# Patient Record
Sex: Male | Born: 1955 | Race: Black or African American | Hispanic: No | Marital: Single | Smoking: Former smoker
Health system: Southern US, Community
[De-identification: ages and names within clinical notes are randomized; demographics above are authoritative.]

## PROBLEM LIST (undated history)

## (undated) DIAGNOSIS — I429 Cardiomyopathy, unspecified: Secondary | ICD-10-CM

## (undated) DIAGNOSIS — K760 Fatty (change of) liver, not elsewhere classified: Secondary | ICD-10-CM

## (undated) DIAGNOSIS — M62838 Other muscle spasm: Secondary | ICD-10-CM

## (undated) DIAGNOSIS — M1990 Unspecified osteoarthritis, unspecified site: Secondary | ICD-10-CM

## (undated) DIAGNOSIS — M79674 Pain in right toe(s): Secondary | ICD-10-CM

## (undated) DIAGNOSIS — F17200 Nicotine dependence, unspecified, uncomplicated: Secondary | ICD-10-CM

## (undated) DIAGNOSIS — R0602 Shortness of breath: Secondary | ICD-10-CM

## (undated) DIAGNOSIS — I10 Essential (primary) hypertension: Secondary | ICD-10-CM

## (undated) DIAGNOSIS — K625 Hemorrhage of anus and rectum: Secondary | ICD-10-CM

## (undated) DIAGNOSIS — I499 Cardiac arrhythmia, unspecified: Secondary | ICD-10-CM

## (undated) DIAGNOSIS — E663 Overweight: Secondary | ICD-10-CM

## (undated) DIAGNOSIS — R001 Bradycardia, unspecified: Secondary | ICD-10-CM

## (undated) DIAGNOSIS — Z8709 Personal history of other diseases of the respiratory system: Secondary | ICD-10-CM

## (undated) DIAGNOSIS — Z5900 Homelessness unspecified: Secondary | ICD-10-CM

## (undated) DIAGNOSIS — E7800 Pure hypercholesterolemia, unspecified: Secondary | ICD-10-CM

## (undated) DIAGNOSIS — F1020 Alcohol dependence, uncomplicated: Secondary | ICD-10-CM

## (undated) DIAGNOSIS — H269 Unspecified cataract: Secondary | ICD-10-CM

## (undated) DIAGNOSIS — F1210 Cannabis abuse, uncomplicated: Secondary | ICD-10-CM

## (undated) DIAGNOSIS — Z8719 Personal history of other diseases of the digestive system: Secondary | ICD-10-CM

## (undated) DIAGNOSIS — I509 Heart failure, unspecified: Secondary | ICD-10-CM

## (undated) HISTORY — DX: Alcohol dependence, uncomplicated: F10.20

## (undated) HISTORY — DX: Pure hypercholesterolemia, unspecified: E78.00

## (undated) HISTORY — DX: Fatty (change of) liver, not elsewhere classified: K76.0

## (undated) HISTORY — DX: Unspecified osteoarthritis, unspecified site: M19.90

## (undated) HISTORY — PX: SKIN SURGERY: SHX2413

## (undated) HISTORY — DX: Other muscle spasm: M62.838

## (undated) HISTORY — DX: Homelessness unspecified: Z59.00

## (undated) HISTORY — DX: Cannabis abuse, uncomplicated: F12.10

## (undated) HISTORY — DX: Unspecified cataract: H26.9

## (undated) HISTORY — DX: Cardiomyopathy, unspecified: I42.9

## (undated) HISTORY — DX: Bradycardia, unspecified: R00.1

## (undated) HISTORY — DX: Personal history of other diseases of the respiratory system: Z87.09

## (undated) HISTORY — DX: Pain in right toe(s): M79.674

## (undated) HISTORY — DX: Nicotine dependence, unspecified, uncomplicated: F17.200

## (undated) HISTORY — DX: Personal history of other diseases of the digestive system: Z87.19

## (undated) HISTORY — DX: Overweight: E66.3

## (undated) HISTORY — PX: LUNG SURGERY: SHX703

## (undated) HISTORY — DX: Shortness of breath: R06.02

## (undated) HISTORY — DX: Hemorrhage of anus and rectum: K62.5

## (undated) HISTORY — PX: SHOULDER SURGERY: SHX246

---

## 1998-04-09 ENCOUNTER — Emergency Department (HOSPITAL_COMMUNITY): Admission: EM | Admit: 1998-04-09 | Discharge: 1998-04-09 | Payer: Self-pay | Admitting: Emergency Medicine

## 1998-05-01 ENCOUNTER — Emergency Department (HOSPITAL_COMMUNITY): Admission: EM | Admit: 1998-05-01 | Discharge: 1998-05-01 | Payer: Self-pay | Admitting: Emergency Medicine

## 1998-07-19 ENCOUNTER — Emergency Department (HOSPITAL_COMMUNITY): Admission: EM | Admit: 1998-07-19 | Discharge: 1998-07-19 | Payer: Self-pay | Admitting: Emergency Medicine

## 2000-12-15 ENCOUNTER — Emergency Department (HOSPITAL_COMMUNITY): Admission: EM | Admit: 2000-12-15 | Discharge: 2000-12-15 | Payer: Self-pay

## 2002-05-03 ENCOUNTER — Encounter: Payer: Self-pay | Admitting: Emergency Medicine

## 2002-05-03 ENCOUNTER — Emergency Department (HOSPITAL_COMMUNITY): Admission: EM | Admit: 2002-05-03 | Discharge: 2002-05-03 | Payer: Self-pay | Admitting: Emergency Medicine

## 2007-01-14 ENCOUNTER — Emergency Department (HOSPITAL_COMMUNITY): Admission: EM | Admit: 2007-01-14 | Discharge: 2007-01-14 | Payer: Self-pay | Admitting: Emergency Medicine

## 2007-08-07 ENCOUNTER — Ambulatory Visit: Payer: Self-pay | Admitting: Family Medicine

## 2007-08-07 LAB — CONVERTED CEMR LAB
Albumin: 4.3 g/dL (ref 3.5–5.2)
Alkaline Phosphatase: 52 units/L (ref 39–117)
CO2: 21 meq/L (ref 19–32)
Chloride: 106 meq/L (ref 96–112)
Glucose, Bld: 88 mg/dL (ref 70–99)
Lymphocytes Relative: 44 % (ref 12–46)
Lymphs Abs: 1.7 10*3/uL (ref 0.7–3.3)
Neutro Abs: 1.8 10*3/uL (ref 1.7–7.7)
Neutrophils Relative %: 45 % (ref 43–77)
Platelets: 277 10*3/uL (ref 150–400)
Potassium: 4.4 meq/L (ref 3.5–5.3)
Sodium: 142 meq/L (ref 135–145)
T3 Uptake Ratio: 28.7 % (ref 22.5–37.0)
T4, Total: 6.4 ug/dL (ref 5.0–12.5)
TSH: 0.706 microintl units/mL (ref 0.350–5.50)
Total Protein: 7.7 g/dL (ref 6.0–8.3)
WBC: 3.9 10*3/uL — ABNORMAL LOW (ref 4.0–10.5)

## 2007-08-21 ENCOUNTER — Ambulatory Visit: Payer: Self-pay | Admitting: Family Medicine

## 2008-04-11 ENCOUNTER — Emergency Department (HOSPITAL_COMMUNITY): Admission: EM | Admit: 2008-04-11 | Discharge: 2008-04-11 | Payer: Self-pay | Admitting: Emergency Medicine

## 2008-04-13 ENCOUNTER — Ambulatory Visit: Payer: Self-pay | Admitting: *Deleted

## 2008-05-09 ENCOUNTER — Ambulatory Visit: Payer: Self-pay | Admitting: Family Medicine

## 2008-05-09 LAB — CONVERTED CEMR LAB
Basophils Relative: 1 % (ref 0–1)
Cholesterol: 225 mg/dL — ABNORMAL HIGH (ref 0–200)
Eosinophils Absolute: 0.1 10*3/uL (ref 0.0–0.7)
Glucose, Bld: 104 mg/dL — ABNORMAL HIGH (ref 70–99)
HDL: 85 mg/dL (ref >39)
Lymphs Abs: 1.5 10*3/uL (ref 0.7–4.0)
MCV: 93.1 fL (ref 78.0–100.0)
Neutrophils Relative %: 50 % (ref 43–77)
Platelets: 306 10*3/uL (ref 150–400)
Potassium: 4.6 meq/L (ref 3.5–5.3)
Sodium: 142 meq/L (ref 135–145)
Total CHOL/HDL Ratio: 2.6
Triglycerides: 89 mg/dL (ref <150)
VLDL: 18 mg/dL (ref 0–40)
WBC: 4.2 10*3/uL (ref 4.0–10.5)

## 2008-05-11 ENCOUNTER — Ambulatory Visit (HOSPITAL_COMMUNITY): Admission: RE | Admit: 2008-05-11 | Discharge: 2008-05-11 | Payer: Self-pay | Admitting: Family Medicine

## 2008-06-03 ENCOUNTER — Ambulatory Visit: Payer: Self-pay | Admitting: Internal Medicine

## 2008-07-12 ENCOUNTER — Ambulatory Visit (HOSPITAL_COMMUNITY): Admission: RE | Admit: 2008-07-12 | Discharge: 2008-07-12 | Payer: Self-pay | Admitting: Family Medicine

## 2008-08-15 ENCOUNTER — Ambulatory Visit: Payer: Self-pay | Admitting: Family Medicine

## 2009-02-13 ENCOUNTER — Encounter (INDEPENDENT_AMBULATORY_CARE_PROVIDER_SITE_OTHER): Payer: Self-pay | Admitting: Family Medicine

## 2009-02-13 ENCOUNTER — Ambulatory Visit: Payer: Self-pay | Admitting: Family Medicine

## 2009-02-13 LAB — CONVERTED CEMR LAB
Basophils Absolute: 0 10*3/uL (ref 0.0–0.1)
Eosinophils Absolute: 0.1 10*3/uL (ref 0.0–0.7)
Eosinophils Relative: 3 % (ref 0–5)
HCT: 44.8 % (ref 39.0–52.0)
MCHC: 32.8 g/dL (ref 30.0–36.0)
MCV: 91.6 fL (ref 78.0–100.0)
Platelets: 281 10*3/uL (ref 150–400)
RDW: 13.4 % (ref 11.5–15.5)

## 2009-02-27 ENCOUNTER — Ambulatory Visit (HOSPITAL_COMMUNITY): Admission: RE | Admit: 2009-02-27 | Discharge: 2009-02-27 | Payer: Self-pay | Admitting: Internal Medicine

## 2009-02-28 ENCOUNTER — Ambulatory Visit: Payer: Self-pay | Admitting: Family Medicine

## 2009-03-28 ENCOUNTER — Emergency Department (HOSPITAL_COMMUNITY): Admission: EM | Admit: 2009-03-28 | Discharge: 2009-03-28 | Payer: Self-pay | Admitting: Emergency Medicine

## 2009-03-29 ENCOUNTER — Ambulatory Visit: Payer: Self-pay | Admitting: Internal Medicine

## 2009-04-17 ENCOUNTER — Encounter (INDEPENDENT_AMBULATORY_CARE_PROVIDER_SITE_OTHER): Payer: Self-pay | Admitting: Family Medicine

## 2009-04-17 ENCOUNTER — Ambulatory Visit: Payer: Self-pay | Admitting: Internal Medicine

## 2009-04-17 LAB — CONVERTED CEMR LAB
ALT: 17 units/L (ref 0–53)
AST: 18 units/L (ref 0–37)
Alkaline Phosphatase: 37 units/L — ABNORMAL LOW (ref 39–117)
Basophils Absolute: 0 10*3/uL (ref 0.0–0.1)
Calcium: 9.4 mg/dL (ref 8.4–10.5)
Chloride: 107 meq/L (ref 96–112)
Creatinine, Ser: 1.06 mg/dL (ref 0.40–1.50)
Eosinophils Absolute: 0.1 10*3/uL (ref 0.0–0.7)
Lymphocytes Relative: 33 % (ref 12–46)
Lymphs Abs: 1.2 10*3/uL (ref 0.7–4.0)
Neutrophils Relative %: 51 % (ref 43–77)
Platelets: 270 10*3/uL (ref 150–400)
Potassium: 4 meq/L (ref 3.5–5.3)
RDW: 12.8 % (ref 11.5–15.5)
WBC: 3.6 10*3/uL — ABNORMAL LOW (ref 4.0–10.5)

## 2009-06-09 ENCOUNTER — Ambulatory Visit: Payer: Self-pay | Admitting: Family Medicine

## 2009-06-17 ENCOUNTER — Emergency Department (HOSPITAL_COMMUNITY): Admission: EM | Admit: 2009-06-17 | Discharge: 2009-06-17 | Payer: Self-pay | Admitting: Emergency Medicine

## 2009-06-20 ENCOUNTER — Encounter: Payer: Self-pay | Admitting: Internal Medicine

## 2009-06-20 ENCOUNTER — Ambulatory Visit (HOSPITAL_COMMUNITY): Admission: RE | Admit: 2009-06-20 | Discharge: 2009-06-20 | Payer: Self-pay | Admitting: Gastroenterology

## 2009-07-06 ENCOUNTER — Ambulatory Visit: Payer: Self-pay | Admitting: Internal Medicine

## 2009-07-06 DIAGNOSIS — R0602 Shortness of breath: Secondary | ICD-10-CM | POA: Insufficient documentation

## 2009-07-06 DIAGNOSIS — M129 Arthropathy, unspecified: Secondary | ICD-10-CM | POA: Insufficient documentation

## 2009-07-06 DIAGNOSIS — Z8719 Personal history of other diseases of the digestive system: Secondary | ICD-10-CM

## 2009-07-06 DIAGNOSIS — I498 Other specified cardiac arrhythmias: Secondary | ICD-10-CM

## 2009-07-06 LAB — CONVERTED CEMR LAB
BUN: 10 mg/dL (ref 6–23)
Basophils Absolute: 0 10*3/uL (ref 0.0–0.1)
CO2: 26 meq/L (ref 19–32)
Chloride: 106 meq/L (ref 96–112)
Creatinine, Ser: 1 mg/dL (ref 0.4–1.5)
Eosinophils Absolute: 0.1 10*3/uL (ref 0.0–0.7)
Free T4: 0.9 ng/dL (ref 0.6–1.6)
Glucose, Bld: 107 mg/dL — ABNORMAL HIGH (ref 70–99)
HCT: 43.1 % (ref 39.0–52.0)
Lymphs Abs: 1.5 10*3/uL (ref 0.7–4.0)
MCHC: 34.5 g/dL (ref 30.0–36.0)
MCV: 93.1 fL (ref 78.0–100.0)
Monocytes Absolute: 0.6 10*3/uL (ref 0.1–1.0)
Neutrophils Relative %: 60.1 % (ref 43.0–77.0)
Platelets: 257 10*3/uL (ref 150.0–400.0)
Potassium: 3.7 meq/L (ref 3.5–5.1)
RDW: 12 % (ref 11.5–14.6)
TSH: 1.53 microintl units/mL (ref 0.35–5.50)
WBC: 5.4 10*3/uL (ref 4.5–10.5)

## 2009-07-17 ENCOUNTER — Telehealth (INDEPENDENT_AMBULATORY_CARE_PROVIDER_SITE_OTHER): Payer: Self-pay | Admitting: Radiology

## 2009-07-18 ENCOUNTER — Ambulatory Visit: Payer: Self-pay

## 2009-07-18 ENCOUNTER — Encounter: Payer: Self-pay | Admitting: Internal Medicine

## 2009-07-18 ENCOUNTER — Encounter: Payer: Self-pay | Admitting: Cardiology

## 2009-08-22 ENCOUNTER — Ambulatory Visit: Payer: Self-pay | Admitting: Family Medicine

## 2009-08-23 ENCOUNTER — Ambulatory Visit: Payer: Self-pay | Admitting: Internal Medicine

## 2009-08-23 DIAGNOSIS — E7800 Pure hypercholesterolemia, unspecified: Secondary | ICD-10-CM

## 2009-08-25 LAB — CONVERTED CEMR LAB
ALT: 22 units/L (ref 0–53)
Alkaline Phosphatase: 47 units/L (ref 39–117)
Bilirubin, Direct: 0.1 mg/dL (ref 0.0–0.3)
HDL: 66 mg/dL (ref >39.00)
Total Bilirubin: 1.8 mg/dL — ABNORMAL HIGH (ref 0.3–1.2)
Total Protein: 8.8 g/dL — ABNORMAL HIGH (ref 6.0–8.3)

## 2009-11-09 ENCOUNTER — Ambulatory Visit: Payer: Self-pay | Admitting: Family Medicine

## 2009-11-09 LAB — CONVERTED CEMR LAB
ALT: 17 units/L (ref 0–53)
AST: 26 units/L (ref 0–37)
Basophils Absolute: 0 10*3/uL (ref 0.0–0.1)
Bilirubin, Direct: 0.1 mg/dL (ref 0.0–0.3)
Eosinophils Absolute: 0.1 10*3/uL (ref 0.0–0.7)
Eosinophils Relative: 2 % (ref 0–5)
HCT: 40.8 % (ref 39.0–52.0)
Hemoglobin: 14 g/dL (ref 13.0–17.0)
Indirect Bilirubin: 0.6 mg/dL (ref 0.0–0.9)
Lymphocytes Relative: 30 % (ref 12–46)
Lymphs Abs: 1.4 10*3/uL (ref 0.7–4.0)
MCV: 91.5 fL (ref 78.0–100.0)
Monocytes Absolute: 0.5 10*3/uL (ref 0.1–1.0)
Platelets: 282 10*3/uL (ref 150–400)
RDW: 12.2 % (ref 11.5–15.5)
Total Protein: 7.4 g/dL (ref 6.0–8.3)
Vit D, 25-Hydroxy: 12 ng/mL — ABNORMAL LOW (ref 30–89)

## 2009-12-19 ENCOUNTER — Ambulatory Visit: Payer: Self-pay | Admitting: Family Medicine

## 2010-05-17 ENCOUNTER — Ambulatory Visit: Payer: Self-pay | Admitting: Family Medicine

## 2010-08-30 ENCOUNTER — Ambulatory Visit: Payer: Self-pay | Admitting: Family Medicine

## 2011-04-08 LAB — CLOTEST (H. PYLORI), BIOPSY: Helicobacter screen: NEGATIVE

## 2011-04-11 LAB — APTT: aPTT: 24 seconds (ref 24–37)

## 2011-04-11 LAB — DIFFERENTIAL
Basophils Relative: 1 % (ref 0–1)
Lymphocytes Relative: 38 % (ref 12–46)
Lymphs Abs: 1.4 10*3/uL (ref 0.7–4.0)
Monocytes Absolute: 0.5 10*3/uL (ref 0.1–1.0)
Monocytes Relative: 14 % — ABNORMAL HIGH (ref 3–12)
Neutro Abs: 1.7 10*3/uL (ref 1.7–7.7)
Neutrophils Relative %: 45 % (ref 43–77)

## 2011-04-11 LAB — URINALYSIS, ROUTINE W REFLEX MICROSCOPIC
Ketones, ur: 15 mg/dL — AB
Nitrite: NEGATIVE
pH: 6 (ref 5.0–8.0)

## 2011-04-11 LAB — POCT CARDIAC MARKERS: CKMB, poc: 1.7 ng/mL (ref 1.0–8.0)

## 2011-04-11 LAB — POCT I-STAT, CHEM 8
Calcium, Ion: 1.1 mmol/L — ABNORMAL LOW (ref 1.12–1.32)
Chloride: 106 mEq/L (ref 96–112)
HCT: 43 % (ref 39.0–52.0)
Potassium: 4 mEq/L (ref 3.5–5.1)
Sodium: 139 mEq/L (ref 135–145)

## 2011-04-11 LAB — SAMPLE TO BLOOD BANK

## 2011-04-11 LAB — CBC
Hemoglobin: 15.1 g/dL (ref 13.0–17.0)
RBC: 4.63 MIL/uL (ref 4.22–5.81)
WBC: 3.8 10*3/uL — ABNORMAL LOW (ref 4.0–10.5)

## 2011-04-11 LAB — PROTIME-INR: INR: 1 (ref 0.00–1.49)

## 2011-04-12 ENCOUNTER — Ambulatory Visit (HOSPITAL_COMMUNITY)
Admission: RE | Admit: 2011-04-12 | Discharge: 2011-04-12 | Disposition: A | Discharge status: Home / Self Care | Payer: Self-pay | Source: Ambulatory Visit | Attending: Family Medicine | Admitting: Family Medicine

## 2011-04-12 ENCOUNTER — Other Ambulatory Visit (HOSPITAL_COMMUNITY): Payer: Self-pay | Admitting: Family Medicine

## 2011-04-12 DIAGNOSIS — M546 Pain in thoracic spine: Secondary | ICD-10-CM | POA: Insufficient documentation

## 2011-04-12 DIAGNOSIS — M545 Low back pain, unspecified: Secondary | ICD-10-CM | POA: Insufficient documentation

## 2011-04-12 DIAGNOSIS — R52 Pain, unspecified: Secondary | ICD-10-CM

## 2011-05-14 NOTE — Op Note (Signed)
NAME:  ECTOR, LAUREL NO.:  [id number]   MEDICAL RECORD NO.:  [id number]          PATIENT TYPE:  AMB   LOCATION:  ENDO                         FACILITY:  [facility]   PHYSICIAN:  Ulyess Mort, MD  DATE OF BIRTH:  June 11, 1956   DATE OF PROCEDURE:  06/20/2009  DATE OF DISCHARGE:  06/20/2009                               OPERATIVE REPORT   PROCEDURE:  Colonoscopy and upper endoscopy.   INDICATIONS FOR PROCEDURE:  The patient is a 55 year old African  American who presents with a several-month history of bright red blood  per rectum.  Has not seen any red blood in the stool.  He sees it with  wiping primarily.  It is intermittent.  He says his rectum is  burning._there has been no history in the past of hemorrhoids.  There  are no GI symptoms of any sort.  No change in bowel habits.  He drinks  beer daily and takes some sinus medicine, otherwise he is not taking any  other medicine.  He had heme positive stools when he was examined by Dr.  Audria Nine.  He is on Tramadol 50 every 8 hours and some Protonix 40 mg  daily.  He has no family history of any malignancies that he is aware  of.  He has basically been in good health in the past.  He is  unemployed, but works as a Gaffer intermittently.  The patient has had  a lung resection in the past and also has a history of arthritis.  The  lung resection was related to spontaneous pneumothorax.   PHYSICAL EXAMINATION:  VITAL SIGNS:  Pulse 44 with a sinus bradycardia  and short PR interval.  This was noted on a 12-lead EKG.  No evidence of  any block.  He denies any syncopal episodes in the past or any shortness  of breath.  His blood pressure was 140/83, respirations 18.  HEENT:  His eyes were somewhat blood-shot.  His oropharynx was negative.  NECK:  Negative.  CHEST:  Clear.  HEART:  Bradycardia with no murmurs of significance.  ABDOMEN:  Soft.  No masses or organomegaly.  Nontender.  EXTREMITIES:  Unremarkable.   IMPRESSION:  1. Rectal bleeding as described.  2. Sinus bradycardia.  3. History of lung resection secondary to pneumothorax.  4. Arthritis.   RECOMMENDATIONS:  The patient will have a colonoscopic examination and  if negative we will proceed with an upper endoscopy.   ANESTHESIA:  The patient is premedicated with 100 mg of Fentanyl, 10 mg  of Versed and 12.5 mg of Phenergan IV.  Oropharynx was anesthetized with  Cetacaine spray.   DESCRIPTION OF PROCEDURE:  The Pentax colonoscope was advanced  throughout the entire colon without difficulty.  On retraction a careful  inspection of the colon revealed a well-prepped colon for the most part.  There were a few small diverticula in the right and left colon, but  nothing of great significance.  The endoscope was brought back into the  rectum and turned into the J position and no significant abnormalities  were noted.  The  endoscope was then returned back to normal position and  retracted completely and 1+ hemorrhoids was noted, but otherwise it was  negative.  Rectal examination revealed no abnormalities.   IMPRESSION:  Colon from rectum to sigmoid revealed several tiny  diverticula with 1+ hemorrhoids, otherwise entirely normal colon  examination.   PROCEDURE:  Upper endoscopy with same history.   ANESTHESIA:  Same.   DESCRIPTION OF PROCEDURE:  The Pentax endoscope was passed into the  proximal esophagus without difficulty.  The proximal esophagus was  normal.  Distal esophagus revealed a 1-2 cm sliding hiatal hernia.  The  stomach was well-visualized in its entirety and revealed a mild diffuse  gastritis and some antral streaking consistent with mild to moderate  antritis.  __________ was normal.  The duodenal bulb revealed minimal  changes of duodenitis.  The C-loop was normal.  The endoscope was  brought back into the stomach and the stomach was well-visualized and no  abnormalities were seen.  The endoscope was retracted  completely after  CLOtest was obtained and the patient tolerated the procedure nicely.   IMPRESSION:  1. Esophagus with small hiatal hernia as described.  2. Mild to moderate gastritis with CLOtest obtained and some moderate      antritis.  3. Pyloric channel with slight inflammation.  4. Duodenal bulb with mild duodenitis.  5. The procedure was normal.  There were some inflammatory changes      here and I would suspect this is on the basis of medication and/or      increased alcohol intake as a culprit here.  I do not think this is      the etiology for his positive stools or bleeding, however, and I      would treat him continuously with proton pump inhibitor for the      next 6-10 weeks and then wean him off carefully and hopefully we      can decrease and wean him off from his alcohol intake.  I think      that he should have a follow-up colonoscopic examination in 5 years      unless there is further evidence of bleeding or change in bowel      habits.      Ulyess Mort, MD  Electronically Signed     SML/MEDQ  D:  06/20/2009  T:  06/21/2009  Job:  [id number]   cc:   Dala Dock   Dr. Dory Horn

## 2011-05-21 IMAGING — CR DG THORACIC SPINE 2V
3 series · 3 of 3 positions shown · non-contrast
Comparison: Chest x-ray 07/06/2009

CLINICAL DATA: Chronic pain.  No injury.

THORACIC SPINE - 2 VIEW

[t t-spine a.p.]
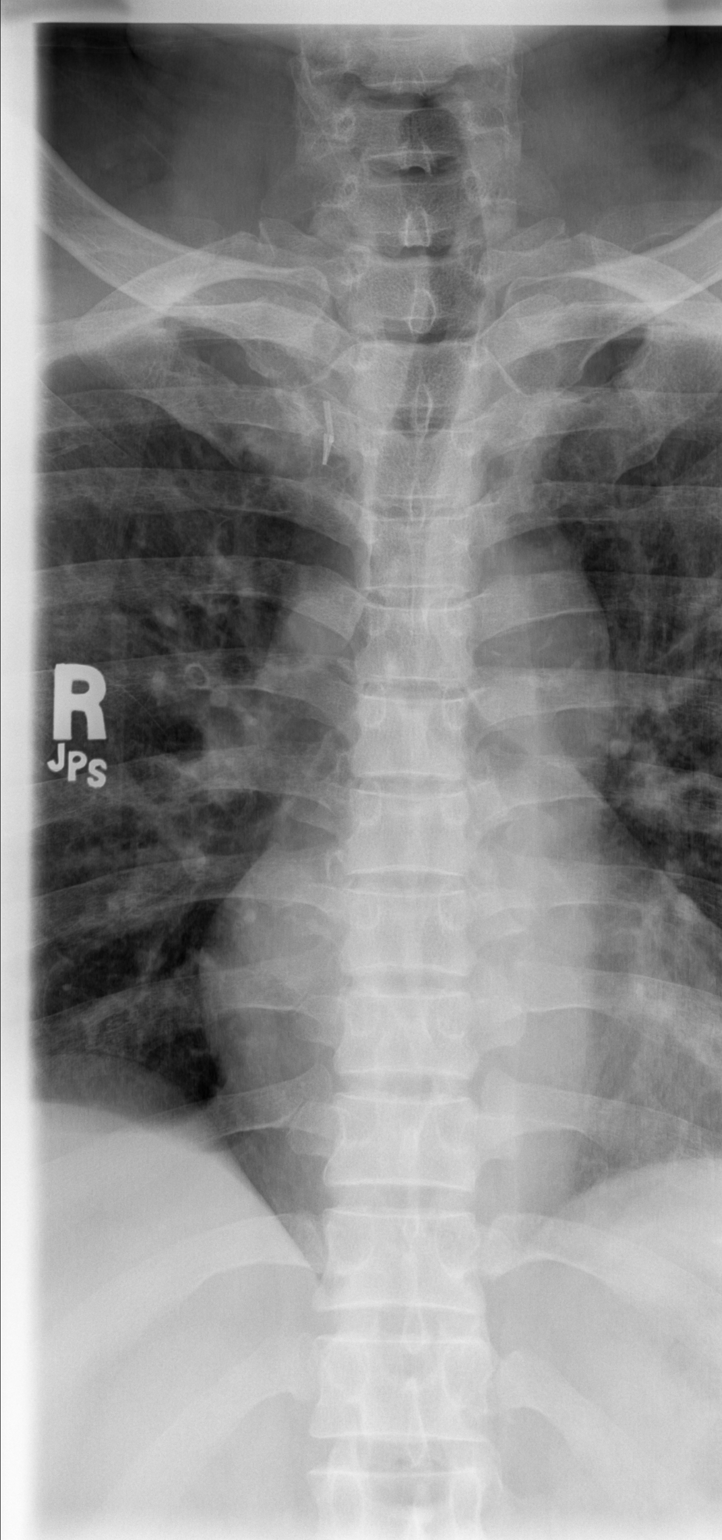

[t t-spine lat *]
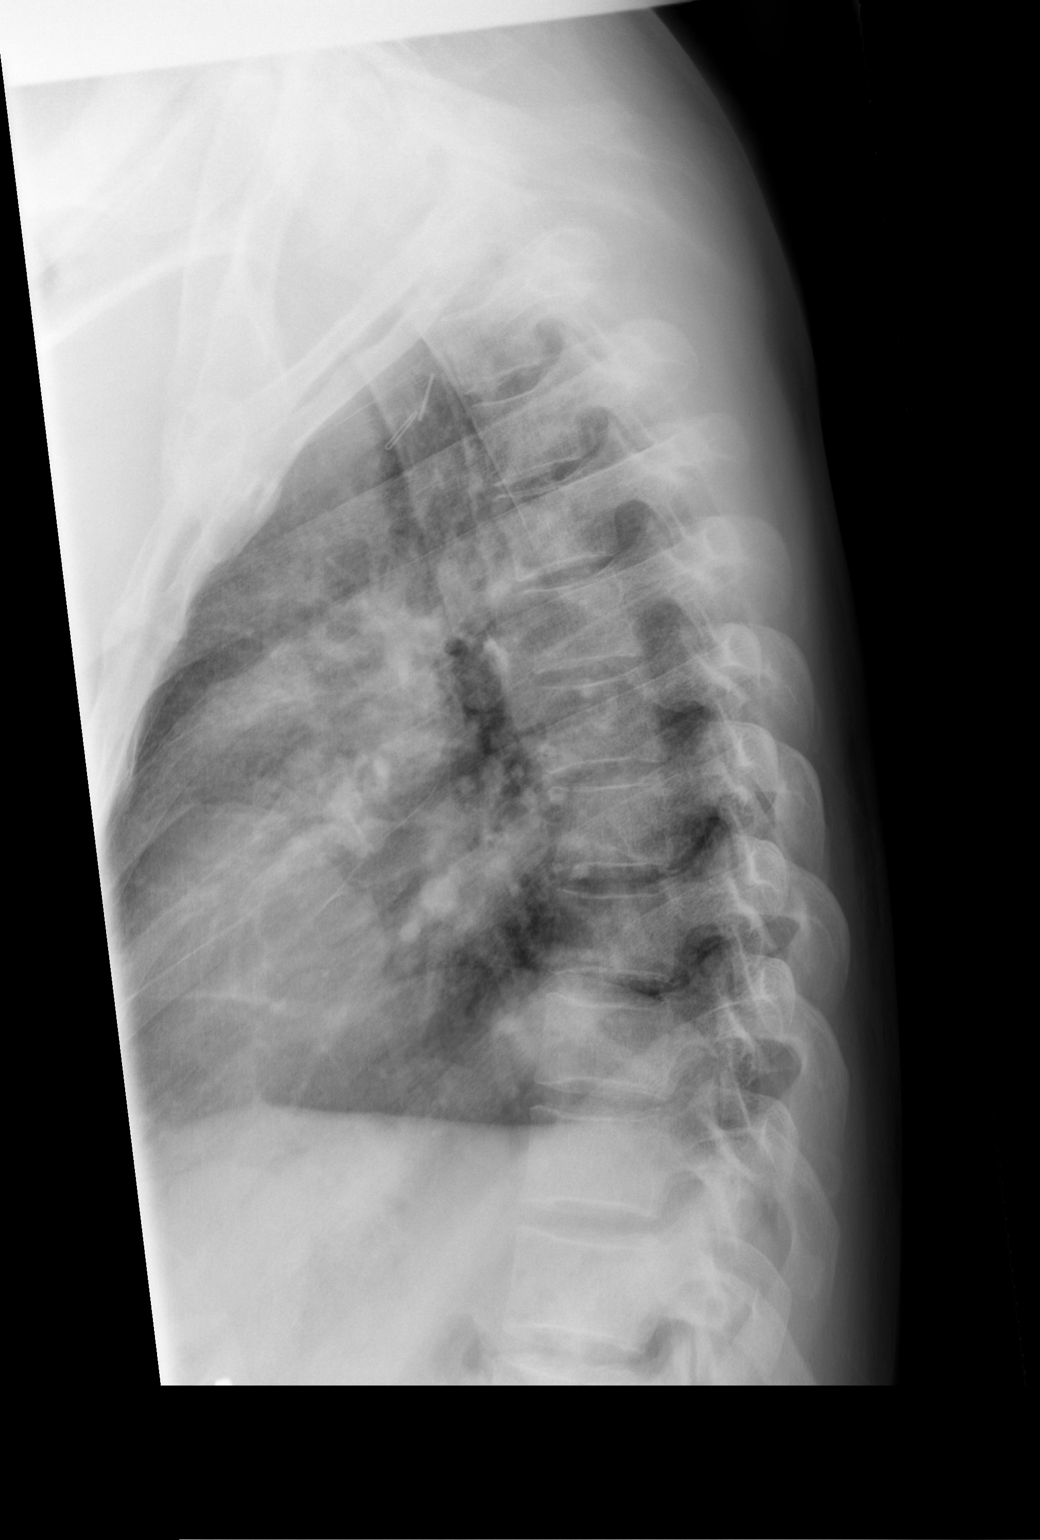

[t swimmers]
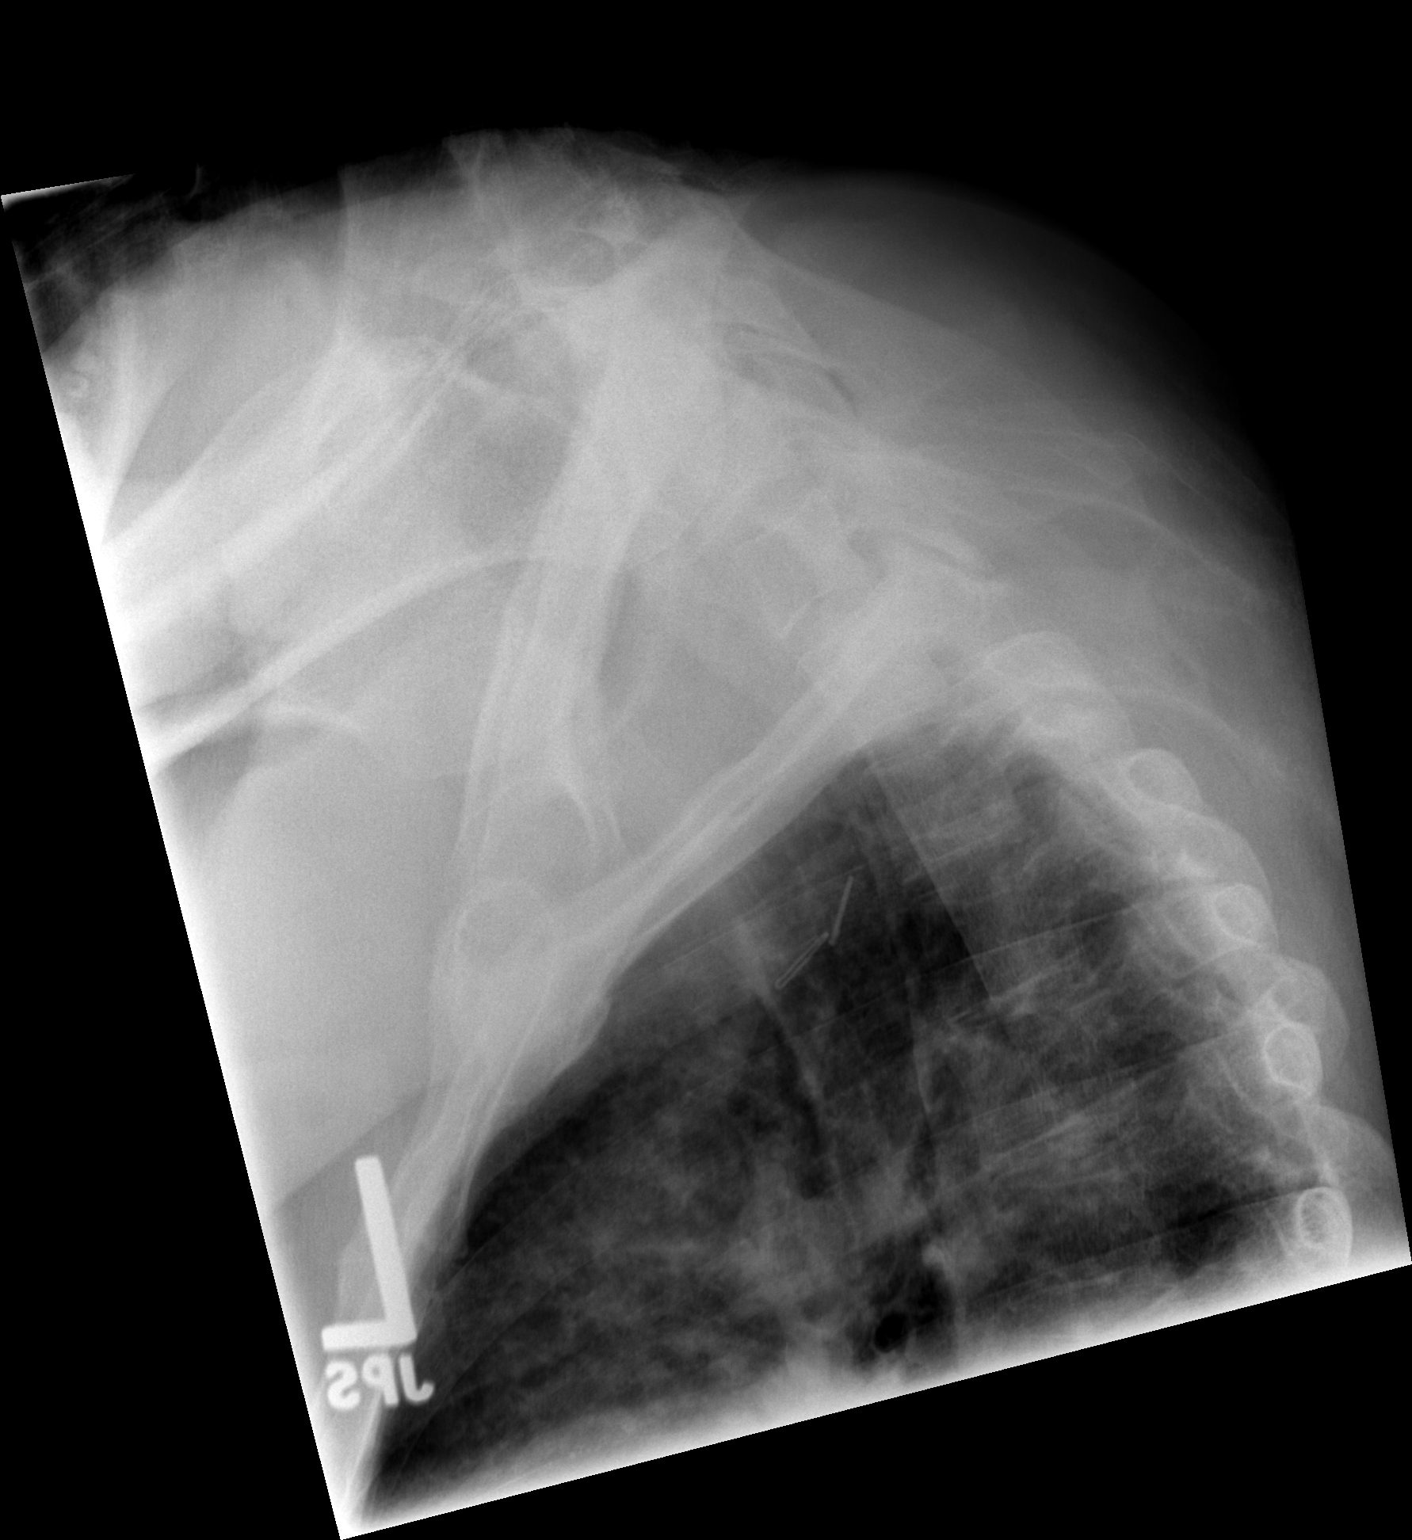

[3 of 3 positions shown; findings below may reference images not displayed]

FINDINGS: No acute bony abnormality.  Specifically, no fracture or
malalignment.  No significant degenerative disease.
IMPRESSION: No acute findings or significant abnormality.

## 2011-05-21 IMAGING — CR DG LUMBAR SPINE COMPLETE 4+V
5 series · 5 of 5 positions shown · non-contrast
Comparison: CT 03/28/2009

CLINICAL DATA: Low back pain, chronic.  No injury.

LUMBAR SPINE - COMPLETE 4+ VIEW

[t l-spine a.p.]
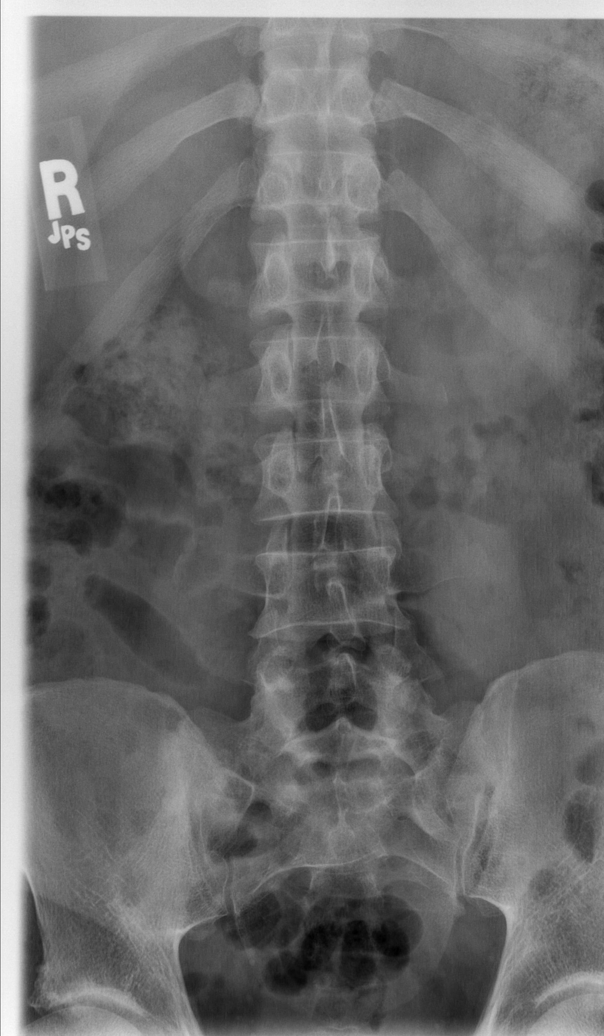

[t l-spine oblique exposure (1 of 2)]
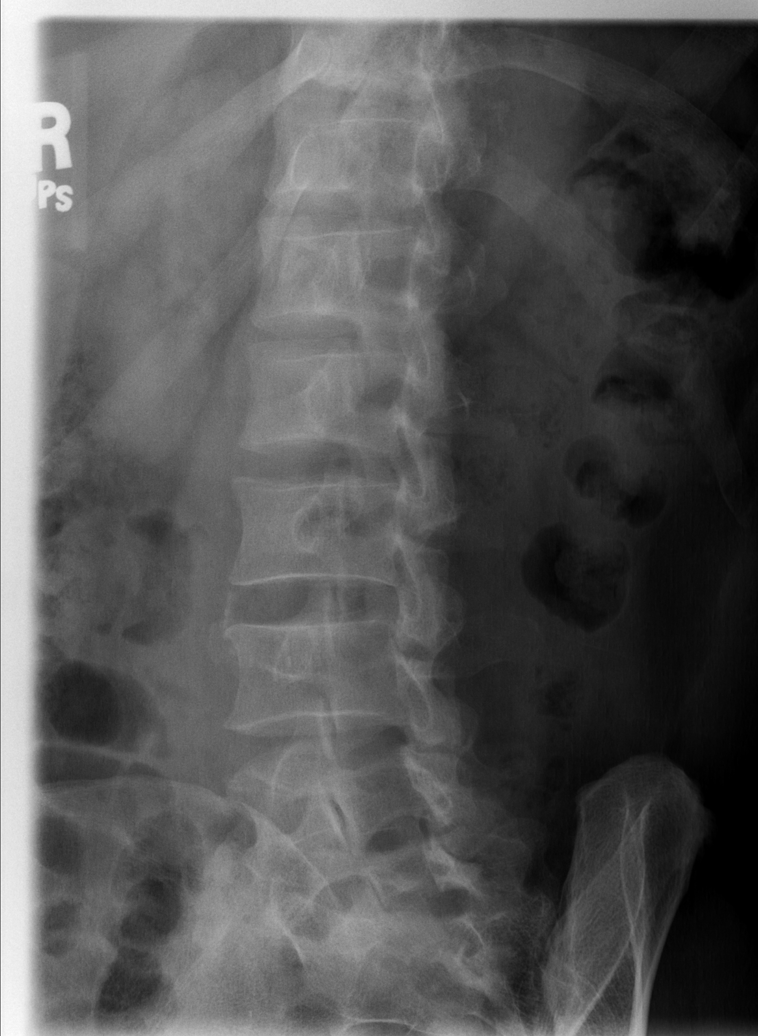

[t l-spine oblique exposure (2 of 2)]
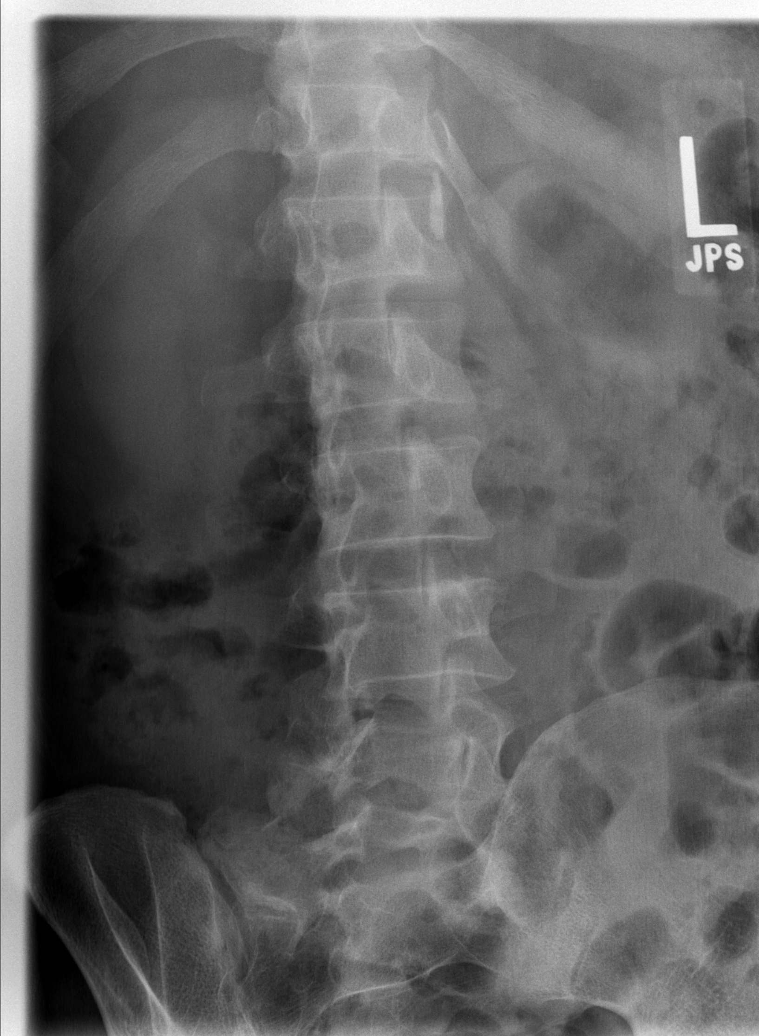

[t l-spine lat]
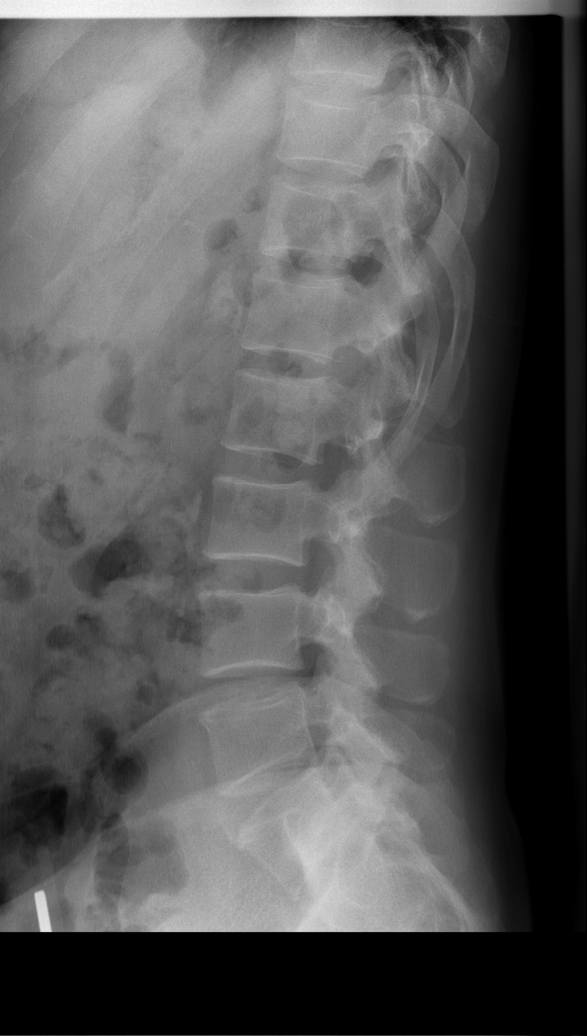

[t l-spine l5-s1 spot]
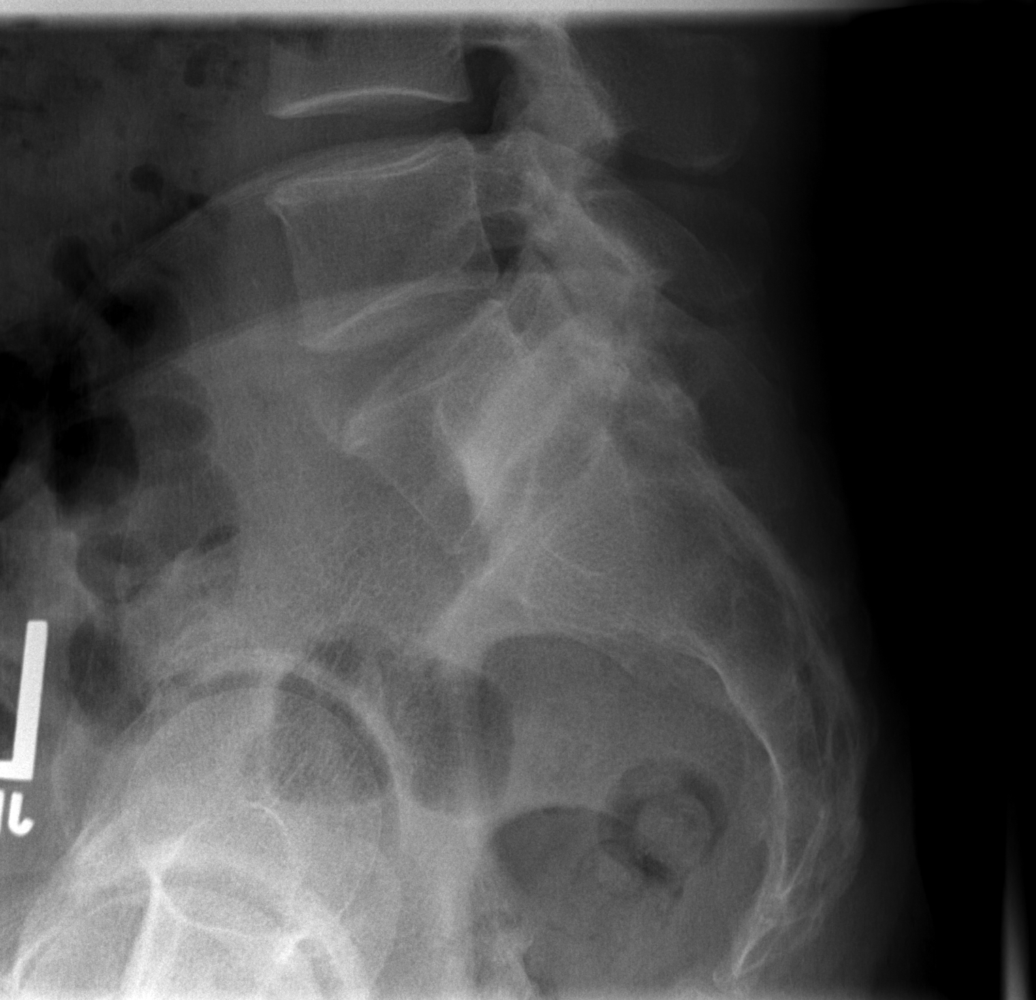

[5 of 5 positions shown; findings below may reference images not displayed]

FINDINGS: There are five lumbar-type vertebral bodies.  No fracture
or malalignment.  Disc spaces well maintained.  SI joints are
symmetric. Mild facet arthropathy noted in the lower lumbar spine
at L3-4 through L5-S1.
IMPRESSION: Lower lumbar facet arthropathy.  No acute findings.

## 2011-07-01 ENCOUNTER — Ambulatory Visit: Payer: Self-pay | Admitting: Physical Therapy

## 2011-07-17 ENCOUNTER — Ambulatory Visit: Payer: Self-pay | Attending: Family Medicine | Admitting: Physical Therapy

## 2011-07-17 DIAGNOSIS — IMO0001 Reserved for inherently not codable concepts without codable children: Secondary | ICD-10-CM | POA: Insufficient documentation

## 2011-07-17 DIAGNOSIS — M545 Low back pain, unspecified: Secondary | ICD-10-CM | POA: Insufficient documentation

## 2011-07-17 DIAGNOSIS — R293 Abnormal posture: Secondary | ICD-10-CM | POA: Insufficient documentation

## 2011-07-17 DIAGNOSIS — M25659 Stiffness of unspecified hip, not elsewhere classified: Secondary | ICD-10-CM | POA: Insufficient documentation

## 2011-07-17 DIAGNOSIS — M6281 Muscle weakness (generalized): Secondary | ICD-10-CM | POA: Insufficient documentation

## 2011-07-23 ENCOUNTER — Ambulatory Visit: Payer: Self-pay | Admitting: Rehabilitative and Restorative Service Providers"

## 2011-07-30 ENCOUNTER — Ambulatory Visit: Payer: Self-pay | Admitting: Physical Therapy

## 2011-08-01 ENCOUNTER — Ambulatory Visit: Payer: Self-pay | Admitting: Physical Therapy

## 2011-08-06 ENCOUNTER — Ambulatory Visit: Payer: Self-pay | Attending: Family Medicine | Admitting: Physical Therapy

## 2011-08-06 DIAGNOSIS — M25659 Stiffness of unspecified hip, not elsewhere classified: Secondary | ICD-10-CM | POA: Insufficient documentation

## 2011-08-06 DIAGNOSIS — M6281 Muscle weakness (generalized): Secondary | ICD-10-CM | POA: Insufficient documentation

## 2011-08-06 DIAGNOSIS — IMO0001 Reserved for inherently not codable concepts without codable children: Secondary | ICD-10-CM | POA: Insufficient documentation

## 2011-08-06 DIAGNOSIS — M545 Low back pain, unspecified: Secondary | ICD-10-CM | POA: Insufficient documentation

## 2011-08-06 DIAGNOSIS — R293 Abnormal posture: Secondary | ICD-10-CM | POA: Insufficient documentation

## 2011-08-08 ENCOUNTER — Ambulatory Visit: Payer: Self-pay | Admitting: Physical Therapy

## 2011-08-21 ENCOUNTER — Encounter: Payer: Self-pay | Admitting: Physical Therapy

## 2011-08-28 ENCOUNTER — Encounter: Payer: Self-pay | Admitting: Physical Therapy

## 2011-10-14 ENCOUNTER — Emergency Department (HOSPITAL_COMMUNITY)
Admission: EM | Admit: 2011-10-14 | Discharge: 2011-10-14 | Disposition: A | Discharge status: Home / Self Care | Payer: Self-pay | Attending: Emergency Medicine | Admitting: Emergency Medicine

## 2011-10-14 DIAGNOSIS — L299 Pruritus, unspecified: Secondary | ICD-10-CM | POA: Insufficient documentation

## 2011-10-14 DIAGNOSIS — Z79899 Other long term (current) drug therapy: Secondary | ICD-10-CM | POA: Insufficient documentation

## 2011-10-14 LAB — POCT I-STAT, CHEM 8
BUN: 13 mg/dL (ref 6–23)
Calcium, Ion: 1.23 mmol/L (ref 1.12–1.32)
Chloride: 105 mEq/L (ref 96–112)
Glucose, Bld: 103 mg/dL — ABNORMAL HIGH (ref 70–99)
HCT: 44 % (ref 39.0–52.0)

## 2012-01-27 ENCOUNTER — Ambulatory Visit (HOSPITAL_COMMUNITY)
Admission: RE | Admit: 2012-01-27 | Discharge: 2012-01-27 | Disposition: A | Discharge status: Home / Self Care | Payer: Self-pay | Source: Ambulatory Visit | Attending: Family Medicine | Admitting: Family Medicine

## 2012-01-27 ENCOUNTER — Other Ambulatory Visit (HOSPITAL_COMMUNITY): Payer: Self-pay | Admitting: Family Medicine

## 2012-01-27 DIAGNOSIS — R0602 Shortness of breath: Secondary | ICD-10-CM | POA: Insufficient documentation

## 2012-01-27 DIAGNOSIS — R059 Cough, unspecified: Secondary | ICD-10-CM | POA: Insufficient documentation

## 2012-01-27 DIAGNOSIS — R0689 Other abnormalities of breathing: Secondary | ICD-10-CM

## 2012-01-27 DIAGNOSIS — R05 Cough: Secondary | ICD-10-CM | POA: Insufficient documentation

## 2012-02-25 ENCOUNTER — Other Ambulatory Visit (HOSPITAL_COMMUNITY): Payer: Self-pay | Admitting: Family Medicine

## 2012-02-27 ENCOUNTER — Ambulatory Visit (HOSPITAL_COMMUNITY)
Admission: RE | Admit: 2012-02-27 | Discharge: 2012-02-27 | Disposition: A | Discharge status: Home / Self Care | Payer: Self-pay | Source: Ambulatory Visit | Attending: Family Medicine | Admitting: Family Medicine

## 2012-02-27 DIAGNOSIS — R109 Unspecified abdominal pain: Secondary | ICD-10-CM | POA: Insufficient documentation

## 2012-02-27 DIAGNOSIS — K7689 Other specified diseases of liver: Secondary | ICD-10-CM | POA: Insufficient documentation

## 2012-03-10 ENCOUNTER — Ambulatory Visit (HOSPITAL_COMMUNITY)
Admission: RE | Admit: 2012-03-10 | Discharge: 2012-03-10 | Disposition: A | Discharge status: Home / Self Care | Payer: Self-pay | Source: Ambulatory Visit | Attending: Family Medicine | Admitting: Family Medicine

## 2012-03-10 DIAGNOSIS — I059 Rheumatic mitral valve disease, unspecified: Secondary | ICD-10-CM | POA: Insufficient documentation

## 2012-03-10 DIAGNOSIS — R072 Precordial pain: Secondary | ICD-10-CM | POA: Insufficient documentation

## 2012-03-10 DIAGNOSIS — R0602 Shortness of breath: Secondary | ICD-10-CM

## 2012-03-10 NOTE — Progress Notes (Signed)
  Echocardiogram 2D Echocardiogram has been performed.  Gerald Bradley L 03/10/2012, 2:14 PM

## 2012-04-15 ENCOUNTER — Ambulatory Visit (INDEPENDENT_AMBULATORY_CARE_PROVIDER_SITE_OTHER): Payer: Self-pay | Admitting: Cardiovascular Disease

## 2012-04-15 ENCOUNTER — Encounter: Payer: Self-pay | Admitting: Cardiovascular Disease

## 2012-04-15 VITALS — BP 125/84 | HR 50 | Ht 71.0 in | Wt 161.0 lb

## 2012-04-15 DIAGNOSIS — R0602 Shortness of breath: Secondary | ICD-10-CM

## 2012-04-15 DIAGNOSIS — I428 Other cardiomyopathies: Secondary | ICD-10-CM

## 2012-04-15 NOTE — Patient Instructions (Signed)
.  Your physician recommends that you schedule a follow-up appointment in: 1 month  Your physician has requested that you have a stress echocardiogram. For further information please visit [email or web address]. Please follow instruction sheet as given.    

## 2012-04-19 ENCOUNTER — Encounter: Payer: Self-pay | Admitting: Cardiovascular Disease

## 2012-04-19 DIAGNOSIS — I429 Cardiomyopathy, unspecified: Secondary | ICD-10-CM | POA: Insufficient documentation

## 2012-04-19 NOTE — Assessment & Plan Note (Signed)
Suspect this is noncardiac considering his history of pneumothorax and tobacco use. However, he does have mild LV dysfunction. I have recommended a stress echo to evaluate for ischemia as a cause of exertional dyspnea. If this is negative, I don't think he requires further cardiac evaluation. He was counseled about the need to stop cocaine, Etoh, etc. He understands that polysubstance abuse leads to myocardial dysfunction and this will worsen with ongoing use. I advised him this is most important intervention he can make. If he stays clean, I would favor starting him on an ACE or ARB when he comes back for follow-up. I would avoid beta blockers in the setting of active cocaine use.

## 2012-04-19 NOTE — Assessment & Plan Note (Signed)
Uncertain etiology, but suspect alcohol is primary problem. Check stress echo. Start medical therapy if he stays off illicit drugs and returns for follow-up.

## 2012-04-19 NOTE — Progress Notes (Signed)
   HPI:  56 year-old gentleman referred for follow-up evaluation. He has a history of LV dysfunction with LVEF 45-50% by echo done last month (global hypokinesis). He has developed dyspnea with exertion. This has been progressive and now occurs with usual activity (Class 3). He denies orthopnea, PND, or edema. The patient has been seen by Dr Johney Frame in the past, but his last visit was in 2010 when he also complained of dyspnea. He had an echo showing normal LV function at that time. A Myoview showed question of inferior ischemia, but was low-risk overall.   The patient has not been on any regular cardiac meds. He takes a 'stomach medicine' but doesn't know the name. He has a history of spontaneous pneumothorax, tobacco abuse, alcohol abuse, and regular ongoing cocaine use. He has fleeting nonexeritonal chest pains. He denies palpitations, orthopnea, or PND.   No outpatient encounter prescriptions on file as of 04/15/2012.    Review of patient's allergies indicates no known allergies.  Past Medical History  Diagnosis Date  . Rectal bleeding     Hx of  . Arthritis   . Bradycardia     Past Surgical History  Procedure Date  . Lung surgery     resection for spontaneous pneumothorax skin graft    History   Social History  . Marital Status: Single    Spouse Name: N/A    Number of Children: N/A  . Years of Education: N/A   Occupational History  . Not on file.   Social History Main Topics  . Smoking status: Former Games developer  . Smokeless tobacco: Not on file  . Alcohol Use: Yes  . Drug Use:   . Sexually Active:    Other Topics Concern  . Not on file   Social History Narrative  . No narrative on file    History reviewed. No pertinent family history.  ROS: General: no fevers/chills/night sweats Eyes: no blurry vision, diplopia, or amaurosis ENT: no sore throat or hearing loss Resp: no cough, wheezing, or hemoptysis CV: no edema or palpitations GI: no abdominal pain, nausea,  vomiting, diarrhea, or constipation GU: no dysuria, frequency, or hematuria Skin: no rash Neuro: no headache, numbness, tingling, or weakness of extremities Musculoskeletal: no joint pain or swelling Heme: no bleeding, DVT, or easy bruising Endo: no polydipsia or polyuria  BP 125/84  Pulse 50  Ht 5\' 11"  (1.803 m)  Wt 73.029 kg (161 lb)  BMI 22.45 kg/m2  PHYSICAL EXAM: Pt is alert and oriented, WD, WN, in no distress. HEENT: normal Neck: JVP normal. Carotid upstrokes normal without bruits. No thyromegaly. Lungs: equal expansion, clear bilaterally CV: Apex is discrete and nondisplaced, RRR without murmur or gallop Abd: soft, NT, +BS, no bruit, no hepatosplenomegaly Back: no CVA tenderness Ext: no C/C/E        Femoral pulses 2+= without bruits        DP/PT pulses intact and = Skin: warm and dry without rash Neuro: CNII-XII intact             Strength intact = bilaterally  EKG:  Marked sinus bradycardia 49 bpm, voltage criteria for LVH without associated ST-T changes.  ASSESSMENT AND PLAN:

## 2012-04-27 ENCOUNTER — Ambulatory Visit (HOSPITAL_COMMUNITY): Payer: Self-pay

## 2012-04-27 ENCOUNTER — Encounter (HOSPITAL_COMMUNITY): Payer: Self-pay | Admitting: *Deleted

## 2012-04-27 ENCOUNTER — Emergency Department (HOSPITAL_COMMUNITY): Payer: Self-pay

## 2012-04-27 ENCOUNTER — Emergency Department (HOSPITAL_COMMUNITY)
Admission: EM | Admit: 2012-04-27 | Discharge: 2012-04-27 | Disposition: A | Discharge status: Other Acute Inpatient Hospital | Payer: Self-pay | Attending: Emergency Medicine | Admitting: Emergency Medicine

## 2012-04-27 DIAGNOSIS — S90111A Contusion of right great toe without damage to nail, initial encounter: Secondary | ICD-10-CM

## 2012-04-27 DIAGNOSIS — W2203XA Walked into furniture, initial encounter: Secondary | ICD-10-CM | POA: Insufficient documentation

## 2012-04-27 DIAGNOSIS — S90129A Contusion of unspecified lesser toe(s) without damage to nail, initial encounter: Secondary | ICD-10-CM | POA: Insufficient documentation

## 2012-04-27 DIAGNOSIS — Z79899 Other long term (current) drug therapy: Secondary | ICD-10-CM | POA: Insufficient documentation

## 2012-04-27 DIAGNOSIS — R609 Edema, unspecified: Secondary | ICD-10-CM | POA: Insufficient documentation

## 2012-04-27 DIAGNOSIS — Z8739 Personal history of other diseases of the musculoskeletal system and connective tissue: Secondary | ICD-10-CM | POA: Insufficient documentation

## 2012-04-27 MED ORDER — IBUPROFEN 800 MG PO TABS: 800.0000 mg | ORAL_TABLET | Freq: Three times a day (TID) | ORAL | Status: AC

## 2012-04-27 NOTE — ED Notes (Signed)
Pt able to use crutches without any difficulty

## 2012-04-27 NOTE — ED Notes (Signed)
Pt states he "bumped" his foot on a table leg and the pain has been increasing since questions.  R foot with edema, especially in digits.

## 2012-04-27 NOTE — ED Provider Notes (Signed)
Medical screening examination/treatment/procedure(s) were performed by non-physician practitioner and as supervising physician I was immediately available for consultation/collaboration.   Tremayne Sheldon, MD 04/27/12 2006 

## 2012-04-27 NOTE — ED Notes (Signed)
Pt states hurt rt foot by big toe on sat by accidentally kicking a table ,rt big toe throbbing ever since has good pulse to foot hurts to walk

## 2012-04-27 NOTE — Discharge Instructions (Signed)
Take ibuprofen, up to 800mg  three times a day, as needed for pain.  Ice 3 times a day for 15-20 minutes.  Elevate when possible.  Activity as tolerated.  Contact your cardiologist and let him/her know that you will not be able to walk on a treadmill today.  You may return to the ER if your pain worsens or you have any other concerns.

## 2012-04-27 NOTE — ED Provider Notes (Signed)
History     CSN: [id number]  Arrival date & time 04/27/12  0909   First MD Initiated Contact with Patient 04/27/12 1013      Chief Complaint  Patient presents with  . Foot Pain    R    (Consider location/radiation/quality/duration/timing/severity/associated sxs/prior treatment) HPI History provided by pt.   Pt stubbed his right foot on a table "or something" 2 days ago, then on a sofa that evening.  C/o severe, non-radiating pain at great toe w/ associated edema.  Unable to bear weight on forefoot.  Has not taken anything for symptoms.  No associated fever and no h/o gout.    Past Medical History  Diagnosis Date  . Rectal bleeding     Hx of  . Arthritis   . Bradycardia     Past Surgical History  Procedure Date  . Lung surgery     resection for spontaneous pneumothorax skin graft    No family history on file.  History  Substance Use Topics  . Smoking status: Former Smoker    Types: Cigarettes  . Smokeless tobacco: Not on file  . Alcohol Use: No     stop ped 1 month prior      Review of Systems  All other systems reviewed and are negative.    Allergies  Review of patient's allergies indicates no known allergies.  Home Medications   Current Outpatient Rx  Name Route Sig Dispense Refill  . BISMUTH SUBSALICYLATE 262 MG PO CHEW Oral Chew 524 mg by mouth 4 (four) times daily as needed.    . CELECOXIB 200 MG PO CAPS Oral Take 200 mg by mouth daily.    Marland Kitchen CLARITHROMYCIN 500 MG PO TABS Oral Take 500 mg by mouth 2 (two) times daily.    Marland Kitchen METRONIDAZOLE 500 MG PO TABS Oral Take 250 mg by mouth 4 (four) times daily.    Marland Kitchen OMEPRAZOLE 20 MG PO CPDR Oral Take 40 mg by mouth daily.    . TRAMADOL HCL 50 MG PO TABS Oral Take 50 mg by mouth every 6 (six) hours as needed. For pain      BP 134/79  Pulse 52  Temp(Src) 98.3 F (36.8 C) (Oral)  Resp 16  SpO2 98%  Physical Exam  Nursing note and vitals reviewed. Constitutional: He is oriented to person, place, and time.  He appears well-developed and well-nourished. No distress.  HENT:  Head: Normocephalic and atraumatic.  Eyes:       Normal appearance  Neck: Normal range of motion.  Pulmonary/Chest: Effort normal.  Musculoskeletal: Normal range of motion.       Right great toe w/ mild erythema on dorsal surface.  Edema medial forefoot. Diffuse tenderness to deep palpation of great toe w/ painful passive flexion.  Rest of toes and metatarsals non-tender.  Full ROM ankle w/out pain.  2+ DP pulse and distal sensation intact.   Neurological: He is alert and oriented to person, place, and time.  Psychiatric: He has a normal mood and affect. His behavior is normal.    ED Course  Procedures (including critical care time)  Labs Reviewed - No data to display Dg Foot Complete Right  04/27/2012  *RADIOLOGY REPORT*  Clinical Data: Foot pain, great toe swelling  RIGHT FOOT COMPLETE - 3+ VIEW  Comparison: None.  Findings: Three views of the right foot submitted.  No acute fracture or subluxation.  No radiopaque foreign body.  IMPRESSION: No acute fracture or subluxation.  Original Report Authenticated  By: LIVIU POP, M.D.     1. Contusion of great toe, right       MDM  Pt stubbed his right foot 2 days ago and presents w/ pain/edema of great toe.  No h/o gout.  No signs of joint infection on exam. Xray neg for fx/dislocation. Ortho tech provided pt w/ crutches and pt d/c'd home w/ ibuprofen.  Recommended RICE.  Instructed to return if he develops worsening swelling, fever or erythema.         Otilio Miu, [name] 04/27/12 1137

## 2012-04-27 NOTE — ED Notes (Signed)
Ortho paged. 

## 2012-04-27 NOTE — Progress Notes (Signed)
Orthopedic Tech Progress Note Patient Details:  Gerald Bradley 08-04-1956 [id number]  Other Ortho Devices Type of Ortho Device: Crutches Ortho Device Interventions: Application   Shawnie Pons 04/27/2012, 11:43 AM

## 2012-05-19 ENCOUNTER — Encounter: Payer: Self-pay | Admitting: Cardiovascular Disease

## 2012-05-19 ENCOUNTER — Ambulatory Visit (INDEPENDENT_AMBULATORY_CARE_PROVIDER_SITE_OTHER): Payer: Self-pay | Admitting: Cardiovascular Disease

## 2012-05-19 VITALS — BP 128/76 | HR 56 | Ht 71.0 in | Wt 157.1 lb

## 2012-05-19 DIAGNOSIS — R0602 Shortness of breath: Secondary | ICD-10-CM

## 2012-05-19 DIAGNOSIS — I428 Other cardiomyopathies: Secondary | ICD-10-CM

## 2012-05-19 NOTE — Patient Instructions (Addendum)
Your physician has requested that you have a stress echocardiogram. For further information please visit [email or web address] Please follow instruction sheet as given. We will call you with the results of this test.   Your physician recommends that you continue on your current medications as directed. Please refer to the Current Medication list given to you today.  PLEASE REVIEW THE CURRENT LIST OF MEDICATIONS AND CONTACT OUR OFFICE WITH ANY CORRECTIONS.

## 2012-05-25 ENCOUNTER — Encounter: Payer: Self-pay | Admitting: Cardiovascular Disease

## 2012-05-25 NOTE — Progress Notes (Signed)
   HPI:  56 year old gentleman present for followup evaluation. The patient has a mild cardiomyopathy with ejection fraction of 45-50%. He was evaluated last month for exertional dyspnea. He had no associated chest pain or other complaints. I recommended a stress echocardiogram but the patient did not complete the study because he injured his ankle. He has developed no progression of his symptoms and in fact states that his breathing is better. He has used cocaine in alcohol for many years. He tells me that he quit alcohol after her last visit and has not drank in 4-6 weeks. He has continued to use cocaine.  Outpatient Encounter Prescriptions as of 05/19/2012  Medication Sig Dispense Refill  . bismuth subsalicylate (PEPTO BISMOL) 262 MG chewable tablet Chew 524 mg by mouth 4 (four) times daily as needed.      . celecoxib (CELEBREX) 200 MG capsule Take 200 mg by mouth daily.      . clarithromycin (BIAXIN) 500 MG tablet Take 500 mg by mouth 2 (two) times daily.      . metroNIDAZOLE (FLAGYL) 500 MG tablet Take 250 mg by mouth 4 (four) times daily.      Marland Kitchen omeprazole (PRILOSEC) 20 MG capsule Take 40 mg by mouth daily.      . traMADol (ULTRAM) 50 MG tablet Take 50 mg by mouth every 6 (six) hours as needed. For pain        No Known Allergies  Past Medical History  Diagnosis Date  . Rectal bleeding     Hx of  . Arthritis   . Bradycardia     ROS: Negative except as per HPI  BP 128/76  Pulse 56  Ht 5\' 11"  (1.803 m)  Wt 71.269 kg (157 lb 1.9 oz)  BMI 21.91 kg/m2  PHYSICAL EXAM: Pt is alert and oriented, NAD HEENT: normal Neck: JVP - normal, carotids 2+= without bruits Lungs: CTA bilaterally CV: RRR without murmur or gallop Abd: soft, NT, Positive BS, no hepatomegaly Ext: no C/C/E, distal pulses intact and equal Skin: warm/dry no rash  ASSESSMENT AND PLAN: 1. Cardiomyopathy. The patient has minimal symptoms at present. I have not started him on a beta blocker because of cocaine use. I  suspect his cardiomyopathy is related to alcohol and polysubstance abuse. Primary treatment and cessation of alcohol and cocaine. He understands this and was counseled again today. We will reschedule his stress echocardiogram to rule out ischemic heart disease. Followup depending on results of stress echo.  Tonny Bollman 05/25/2012 6:38 AM

## 2012-06-02 ENCOUNTER — Encounter: Payer: Self-pay | Admitting: Cardiovascular Disease

## 2012-06-02 ENCOUNTER — Ambulatory Visit (HOSPITAL_BASED_OUTPATIENT_CLINIC_OR_DEPARTMENT_OTHER): Payer: Self-pay

## 2012-06-02 ENCOUNTER — Ambulatory Visit (HOSPITAL_COMMUNITY): Payer: Self-pay | Attending: Cardiovascular Disease

## 2012-06-02 ENCOUNTER — Other Ambulatory Visit (HOSPITAL_COMMUNITY): Payer: Self-pay

## 2012-06-02 DIAGNOSIS — R0602 Shortness of breath: Secondary | ICD-10-CM

## 2012-06-02 DIAGNOSIS — R0609 Other forms of dyspnea: Secondary | ICD-10-CM | POA: Insufficient documentation

## 2012-06-02 DIAGNOSIS — R0989 Other specified symptoms and signs involving the circulatory and respiratory systems: Secondary | ICD-10-CM | POA: Insufficient documentation

## 2012-06-02 DIAGNOSIS — F17200 Nicotine dependence, unspecified, uncomplicated: Secondary | ICD-10-CM | POA: Insufficient documentation

## 2012-06-02 NOTE — Progress Notes (Signed)
Echocardiogram performed.  

## 2012-06-11 ENCOUNTER — Telehealth: Payer: Self-pay | Admitting: Cardiovascular Disease

## 2012-06-11 NOTE — Telephone Encounter (Signed)
Pt notified of stress results.

## 2012-06-11 NOTE — Telephone Encounter (Signed)
Pt rtn call re results 

## 2019-09-28 ENCOUNTER — Ambulatory Visit (HOSPITAL_COMMUNITY)
Admission: EM | Admit: 2019-09-28 | Discharge: 2019-09-28 | Disposition: A | Discharge status: Home / Self Care | Payer: Medicaid Other | Attending: Emergency Medicine | Admitting: Emergency Medicine

## 2019-09-28 ENCOUNTER — Encounter (HOSPITAL_COMMUNITY): Payer: Self-pay

## 2019-09-28 ENCOUNTER — Other Ambulatory Visit: Payer: Self-pay

## 2019-09-28 ENCOUNTER — Ambulatory Visit (INDEPENDENT_AMBULATORY_CARE_PROVIDER_SITE_OTHER): Payer: Medicaid Other

## 2019-09-28 DIAGNOSIS — M25511 Pain in right shoulder: Secondary | ICD-10-CM | POA: Diagnosis not present

## 2019-09-28 MED ORDER — IBUPROFEN 600 MG PO TABS: 600.0000 mg | ORAL_TABLET | Freq: Four times a day (QID) | ORAL | 0 refills | Status: DC | PRN

## 2019-09-28 NOTE — ED Triage Notes (Signed)
Pt states he has had right shoulder and hip pain x 3 weeks.

## 2019-09-28 NOTE — Discharge Instructions (Addendum)
Your xray was normal.    Take the prescribed ibuprofen as needed for your pain.    Follow up with your primary care provider if your pain continues.  Return here if your pain worsens or you develop new symptoms such as weakness, numbness, or tingling.

## 2019-09-28 NOTE — ED Provider Notes (Signed)
[facility]    CSN: [id number] Arrival date & time: 09/28/19  1113      History   Chief Complaint Chief Complaint  Patient presents with  . Shoulder Pain  . Hip Pain    HPI Gerald Bradley is a 63 y.o. male.   Patient presents with pain in his right upper arm after falling 3 weeks ago.  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  He states his hip was not injured in the fall.  He denies head injury or LOC.  He denies numbness, weakness, paresthesias.  The history is provided by the patient.    Past Medical History:  Diagnosis Date  . Arthritis   . Bradycardia   . Rectal bleeding    Hx of    Patient Active Problem List   Diagnosis Date Noted  . Cardiomyopathy 04/19/2012  . HYPERCHOLESTEROLEMIA 08/23/2009  . BRADYCARDIA 07/06/2009  . ARTHRITIS 07/06/2009  . SHORTNESS OF BREATH 07/06/2009  . RECTAL BLEEDING, HX OF 07/06/2009    Past Surgical History:  Procedure Laterality Date  . LUNG SURGERY     resection for spontaneous pneumothorax skin graft       Home Medications    Prior to Admission medications   Medication Sig Start Date End Date Taking? Authorizing Provider  bismuth subsalicylate (PEPTO BISMOL) 262 MG chewable tablet Chew 524 mg by mouth 4 (four) times daily as needed.    [provider]  celecoxib (CELEBREX) 200 MG capsule Take 200 mg by mouth daily.    [provider]  clarithromycin (BIAXIN) 500 MG tablet Take 500 mg by mouth 2 (two) times daily. 04/03/12   [provider]  ibuprofen (ADVIL) 600 MG tablet Take 1 tablet (600 mg total) by mouth every 6 (six) hours as needed. 09/28/19   Mickie Bailate, Briante Loveall H, NP  metroNIDAZOLE (FLAGYL) 500 MG tablet Take 250 mg by mouth 4 (four) times daily. 04/03/12   [provider]  omeprazole (PRILOSEC) 20 MG capsule Take 40 mg by mouth daily.    [provider]  traMADol (ULTRAM) 50 MG tablet Take 50 mg by mouth every 6 (six) hours as needed. For pain     [provider]    Family History History reviewed. No pertinent family history.  Social History Social History   Tobacco Use  . Smoking status: Former Smoker    Types: Cigarettes  . Smokeless tobacco: Never Used  Substance Use Topics  . Alcohol use: Yes    Comment: stop ped 1 month prior  . Drug use: Not on file     Allergies   Patient has no known allergies.   Review of Systems Review of Systems  Constitutional: Negative for chills and fever.  HENT: Negative for ear pain and sore throat.   Eyes: Negative for pain and visual disturbance.  Respiratory: Negative for cough and shortness of breath.   Cardiovascular: Negative for chest pain and palpitations.  Gastrointestinal: Negative for abdominal pain and vomiting.  Genitourinary: Negative for dysuria and hematuria.  Musculoskeletal: Positive for arthralgias. Negative for back pain.  Skin: Negative for color change and rash.  Neurological: Negative for seizures, syncope, weakness and numbness.  All other systems reviewed and are negative.    Physical Exam Triage Vital Signs ED Triage Vitals  Enc Vitals Group     BP 09/28/19 1144 (!) 143/73     Pulse Rate 09/28/19 1144 (!) 59     Resp 09/28/19 1144 18  Temp 09/28/19 1144 97.9 F (36.6 C)     Temp src --      SpO2 09/28/19 1144 97 %     Weight 09/28/19 1142 160 lb (72.6 kg)     Height --      Head Circumference --      Peak Flow --      Pain Score 09/28/19 1142 8     Pain Loc --      Pain Edu? --      Excl. in GC? --    No data found.  Updated Vital Signs BP (!) 143/73 (BP Location: Right Arm)   Pulse (!) 59   Temp 97.9 F (36.6 C)   Resp 18   Wt 160 lb (72.6 kg)   SpO2 97%   BMI 22.32 kg/m   Visual Acuity Right Eye Distance:   Left Eye Distance:   Bilateral Distance:    Right Eye Near:   Left Eye Near:    Bilateral Near:     Physical Exam Vitals signs and nursing note reviewed.  Constitutional:      Appearance: He is  well-developed.  HENT:     Head: Normocephalic and atraumatic.     Mouth/Throat:     Mouth: Mucous membranes are moist.     Pharynx: Oropharynx is clear.  Eyes:     Conjunctiva/sclera: Conjunctivae normal.  Neck:     Musculoskeletal: Neck supple.  Cardiovascular:     Rate and Rhythm: Normal rate and regular rhythm.     Heart sounds: No murmur.  Pulmonary:     Effort: Pulmonary effort is normal. No respiratory distress.     Breath sounds: Normal breath sounds.  Abdominal:     General: Bowel sounds are normal.     Palpations: Abdomen is soft.     Tenderness: There is no abdominal tenderness. There is no guarding or rebound.  Musculoskeletal: Normal range of motion.        General: Tenderness present. No swelling or deformity.     Comments: Right upper arm tender to palpation.    Skin:    General: Skin is warm and dry.     Capillary Refill: Capillary refill takes less than 2 seconds.     Findings: No bruising, erythema, lesion or rash.  Neurological:     General: No focal deficit present.     Mental Status: He is alert and oriented to person, place, and time.     Sensory: No sensory deficit.     Motor: No weakness.     Gait: Gait normal.      UC Treatments / Results  Labs (all labs ordered are listed, but only abnormal results are displayed) Labs Reviewed - No data to display  EKG   Radiology Dg Humerus Right  Result Date: 09/28/2019 CLINICAL DATA:  Three-week history of right arm pain.  Fell. EXAM: RIGHT HUMERUS - 2+ VIEW COMPARISON:  None. FINDINGS: The shoulder and elbow joints are grossly maintained. Mild degenerative changes. No acute fracture is identified. No bone lesions. The visualized right ribs are intact. The visualized right lung is clear. IMPRESSION: No acute bony findings. Electronically Signed   By: Rudie Meyer M.D.   On: 09/28/2019 12:45    Procedures Procedures (including critical care time)  Medications Ordered in UC Medications - No data to  display  Initial Impression / Assessment and Plan / UC Course  I have reviewed the triage vital signs and the nursing notes.  Pertinent  labs & imaging results that were available during my care of the patient were reviewed by me and considered in my medical decision making (see chart for details).    Right shoulder pain.  X-ray negative.  Treating with ibuprofen.  Instructed patient to follow-up with his primary care if his pain continues and to discuss his ongoing chronic arthritis in his hip.  Instructed him to return here if his pain worsens or he develops numbness, weakness, paresthesias, or any other concerns.  Patient agrees to plan of care.     Final Clinical Impressions(s) / UC Diagnoses   Final diagnoses:  Acute pain of right shoulder     Discharge Instructions     Your xray was normal.    Take the prescribed ibuprofen as needed for your pain.    Follow up with your primary care provider if your pain continues.  Return here if your pain worsens or you develop new symptoms such as weakness, numbness, or tingling.        ED Prescriptions    Medication Sig Dispense Auth. Provider   ibuprofen (ADVIL) 600 MG tablet Take 1 tablet (600 mg total) by mouth every 6 (six) hours as needed. 30 tablet Sharion Balloon, NP     I have reviewed the PDMP during this encounter.   Sharion Balloon, NP 09/28/19 1252

## 2019-10-05 ENCOUNTER — Encounter (HOSPITAL_COMMUNITY): Payer: Self-pay | Admitting: Emergency Medicine

## 2019-10-05 ENCOUNTER — Ambulatory Visit (HOSPITAL_COMMUNITY)
Admission: EM | Admit: 2019-10-05 | Discharge: 2019-10-05 | Disposition: A | Discharge status: Home / Self Care | Payer: Medicare Other | Attending: Family Medicine | Admitting: Family Medicine

## 2019-10-05 ENCOUNTER — Other Ambulatory Visit: Payer: Self-pay

## 2019-10-05 DIAGNOSIS — M25511 Pain in right shoulder: Secondary | ICD-10-CM | POA: Diagnosis not present

## 2019-10-05 MED ORDER — PREDNISONE 10 MG (21) PO TBPK: ORAL_TABLET | ORAL | 0 refills | Status: DC

## 2019-10-05 MED ORDER — TRAMADOL HCL 50 MG PO TABS: 50.0000 mg | ORAL_TABLET | Freq: Four times a day (QID) | ORAL | 0 refills | Status: DC | PRN

## 2019-10-05 NOTE — Discharge Instructions (Addendum)
Treating your pain, inflammation and swelling with prednisone taper over the next 6 days.  Make sure you take this with food. Tramadol for more severe pain. You can take Tylenol in addition.  Do not take any Aleve or ibuprofen while taking the prednisone. I am concerned about a rotator cuff injury. If your symptoms continue or do not improve you need to follow with orthopedics.

## 2019-10-05 NOTE — ED Triage Notes (Signed)
Pt seen here recently for same c/o continued right shoulder pain now with radiation to right hand

## 2019-10-05 NOTE — ED Provider Notes (Signed)
[facility]    CSN: [id number] Arrival date & time: 10/05/19  1117      History   Chief Complaint Chief Complaint  Patient presents with  . Shoulder Pain    HPI Gerald Bradley is a 63 y.o. male.   Patient is a 63 year old male with past medical history of arthritis, bradycardia.  He presents today with continued right shoulder pain and is now having radiation down the right arm with some numbness and tingling in the right hand and fingers.  This is been a constant problem for him with worsening symptoms.  Had normal x-ray day of last visit.  Has been taking ibuprofen without any relief.  Has very limited range of motion of that shoulder.  No fevers.  No new falls or injuries. No chest pain, SOB. Some mild swelling in the right hand.   ROS per HPI      Past Medical History:  Diagnosis Date  . Arthritis   . Bradycardia   . Rectal bleeding    Hx of    Patient Active Problem List   Diagnosis Date Noted  . Cardiomyopathy 04/19/2012  . HYPERCHOLESTEROLEMIA 08/23/2009  . BRADYCARDIA 07/06/2009  . ARTHRITIS 07/06/2009  . SHORTNESS OF BREATH 07/06/2009  . RECTAL BLEEDING, HX OF 07/06/2009    Past Surgical History:  Procedure Laterality Date  . LUNG SURGERY     resection for spontaneous pneumothorax skin graft       Home Medications    Prior to Admission medications   Medication Sig Start Date End Date Taking? Authorizing Provider  bismuth subsalicylate (PEPTO BISMOL) 262 MG chewable tablet Chew 524 mg by mouth 4 (four) times daily as needed.    [provider]  celecoxib (CELEBREX) 200 MG capsule Take 200 mg by mouth daily.    [provider]  clarithromycin (BIAXIN) 500 MG tablet Take 500 mg by mouth 2 (two) times daily. 04/03/12   [provider]  ibuprofen (ADVIL) 600 MG tablet Take 1 tablet (600 mg total) by mouth every 6 (six) hours as needed. 09/28/19   Sharion Balloon, NP  metroNIDAZOLE (FLAGYL) 500 MG tablet Take 250 mg by  mouth 4 (four) times daily. 04/03/12   [provider]  omeprazole (PRILOSEC) 20 MG capsule Take 40 mg by mouth daily.    [provider]  predniSONE (STERAPRED UNI-PAK 21 TAB) 10 MG (21) TBPK tablet 6 tabs for 1 day, then 5 tabs for 1 das, then 4 tabs for 1 day, then 3 tabs for 1 day, 2 tabs for 1 day, then 1 tab for 1 day 10/05/19   Loura Halt A, NP  traMADol (ULTRAM) 50 MG tablet Take 1 tablet (50 mg total) by mouth every 6 (six) hours as needed. 10/05/19   Orvan July, NP    Family History History reviewed. No pertinent family history.  Social History Social History   Tobacco Use  . Smoking status: Former Smoker    Types: Cigarettes  . Smokeless tobacco: Never Used  Substance Use Topics  . Alcohol use: Yes    Comment: stop ped 1 month prior  . Drug use: Not on file     Allergies   Patient has no known allergies.   Review of Systems Review of Systems   Physical Exam Triage Vital Signs ED Triage Vitals [10/05/19 1146]  Enc Vitals Group     BP (!) 157/97     Pulse Rate 65     Resp 18  Temp 97.8 F (36.6 C)     Temp Source Temporal     SpO2 95 %     Weight      Height      Head Circumference      Peak Flow      Pain Score 8     Pain Loc      Pain Edu?      Excl. in GC?    No data found.  Updated Vital Signs BP (!) 157/97 (BP Location: Left Arm)   Pulse 65   Temp 97.8 F (36.6 C) (Temporal)   Resp 18   SpO2 95%   Visual Acuity Right Eye Distance:   Left Eye Distance:   Bilateral Distance:    Right Eye Near:   Left Eye Near:    Bilateral Near:     Physical Exam Vitals signs and nursing note reviewed.  Constitutional:      General: He is not in acute distress.    Appearance: Normal appearance. He is not ill-appearing, toxic-appearing or diaphoretic.  HENT:     Head: Normocephalic and atraumatic.     Nose: Nose normal.  Eyes:     Conjunctiva/sclera: Conjunctivae normal.  Neck:     Musculoskeletal: Normal range of motion.   Cardiovascular:     Rate and Rhythm: Normal rate and regular rhythm.     Pulses: Normal pulses.     Heart sounds: Normal heart sounds.  Pulmonary:     Effort: Pulmonary effort is normal.     Breath sounds: Normal breath sounds.  Musculoskeletal: Normal range of motion.        General: Tenderness present.     Comments: TTP of the anterior shoulder with limited ROM Mild swelling to the right hand.  Normal temperature and color with good cap refill  Able to wiggle the fingers Radial pulse 2+  Neurological:     Mental Status: He is alert.      UC Treatments / Results  Labs (all labs ordered are listed, but only abnormal results are displayed) Labs Reviewed - No data to display  EKG   Radiology No results found.  Procedures Procedures (including critical care time)  Medications Ordered in UC Medications - No data to display  Initial Impression / Assessment and Plan / UC Course  I have reviewed the triage vital signs and the nursing notes.  Pertinent labs & imaging results that were available during my care of the patient were reviewed by me and considered in my medical decision making (see chart for details).     Right shoulder pain with radiculopathy. Worried about rotator cuff injury Will treat with prednisone taper over the next 6 days and tramadol for more severe pain.  We will have him follow-up with orthopedic for further management if symptoms continue or worsen. Final Clinical Impressions(s) / UC Diagnoses   Final diagnoses:  Acute pain of right shoulder     Discharge Instructions     Treating your pain, inflammation and swelling with prednisone taper over the next 6 days.  Make sure you take this with food. Tramadol for more severe pain. You can take Tylenol in addition.  Do not take any Aleve or ibuprofen while taking the prednisone. I am concerned about a rotator cuff injury. If your symptoms continue or do not improve you need to follow with  orthopedics.    ED Prescriptions    Medication Sig Dispense Auth. Provider   predniSONE (STERAPRED UNI-PAK 21 TAB)  10 MG (21) TBPK tablet 6 tabs for 1 day, then 5 tabs for 1 das, then 4 tabs for 1 day, then 3 tabs for 1 day, 2 tabs for 1 day, then 1 tab for 1 day 21 tablet Camille Dragan A, NP   traMADol (ULTRAM) 50 MG tablet Take 1 tablet (50 mg total) by mouth every 6 (six) hours as needed. 12 tablet Lucine Bilski A, NP     I have reviewed the PDMP during this encounter.   Dahlia Byes A, NP 10/05/19 1407

## 2019-10-21 ENCOUNTER — Other Ambulatory Visit: Payer: Self-pay

## 2019-10-21 ENCOUNTER — Encounter (HOSPITAL_COMMUNITY): Payer: Self-pay

## 2019-10-21 ENCOUNTER — Ambulatory Visit (HOSPITAL_COMMUNITY)
Admission: EM | Admit: 2019-10-21 | Discharge: 2019-10-21 | Disposition: A | Discharge status: Home / Self Care | Payer: Medicare Other | Attending: Internal Medicine | Admitting: Internal Medicine

## 2019-10-21 DIAGNOSIS — S46001D Unspecified injury of muscle(s) and tendon(s) of the rotator cuff of right shoulder, subsequent encounter: Secondary | ICD-10-CM

## 2019-10-21 MED ORDER — NAPROXEN 375 MG PO TABS: 375.0000 mg | ORAL_TABLET | Freq: Two times a day (BID) | ORAL | 0 refills | Status: DC

## 2019-10-21 MED ORDER — TRAMADOL HCL 50 MG PO TABS: 50.0000 mg | ORAL_TABLET | Freq: Four times a day (QID) | ORAL | 0 refills | Status: DC | PRN

## 2019-10-21 MED ORDER — KETOROLAC TROMETHAMINE 30 MG/ML IJ SOLN
INTRAMUSCULAR | Status: AC
  Filled 2019-10-21: qty 1

## 2019-10-21 MED ORDER — KETOROLAC TROMETHAMINE 30 MG/ML IJ SOLN
30.0000 mg | Freq: Once | INTRAMUSCULAR | Status: AC
  Administered 2019-10-21: 15:00:00 30 mg via INTRAMUSCULAR

## 2019-10-21 NOTE — ED Triage Notes (Addendum)
  Patient presents to Urgent Care with complaints of right shoulder pain, chronically for "quite some time". Patient reports now the pain goes all the way into his hand.

## 2019-10-21 NOTE — ED Provider Notes (Signed)
[facility]    CSN: [id number] Arrival date & time: 10/21/19  1351      History   Chief Complaint Chief Complaint  Patient presents with  . Shoulder Pain    HPI Gerald Bradley is a 63 y.o. male with a history of right shoulder pain comes to the urgent care for the third time on account of right shoulder pain.   Patient sustained a fall about a month ago and dislocated his shoulder as a result of the fall.  To reduce the shoulder himself and has been experiencing constant sharp/throbbing pain since then.  He has been treated twice in the urgent care with NSAIDs, prednisone and narcotic medications with no improvement.  Pain is currently 9 out of 10, worsening with movement.  No known relieving factors.  Pain seems to radiate down his right arm.  He denies any numbness or tingling.  HPI  Past Medical History:  Diagnosis Date  . Arthritis   . Bradycardia   . Rectal bleeding    Hx of    Patient Active Problem List   Diagnosis Date Noted  . Cardiomyopathy 04/19/2012  . HYPERCHOLESTEROLEMIA 08/23/2009  . BRADYCARDIA 07/06/2009  . ARTHRITIS 07/06/2009  . SHORTNESS OF BREATH 07/06/2009  . RECTAL BLEEDING, HX OF 07/06/2009    Past Surgical History:  Procedure Laterality Date  . LUNG SURGERY     resection for spontaneous pneumothorax skin graft       Home Medications    Prior to Admission medications   Medication Sig Start Date End Date Taking? Authorizing Provider  bismuth subsalicylate (PEPTO BISMOL) 262 MG chewable tablet Chew 524 mg by mouth 4 (four) times daily as needed.    [provider]  celecoxib (CELEBREX) 200 MG capsule Take 200 mg by mouth daily.    [provider]  ibuprofen (ADVIL) 600 MG tablet Take 1 tablet (600 mg total) by mouth every 6 (six) hours as needed. 09/28/19   Mickie Bailate, Kelly H, NP  naproxen (NAPROSYN) 375 MG tablet Take 1 tablet (375 mg total) by mouth 2 (two) times daily. 10/21/19   Merrilee JanskyLamptey, Philip O, MD  omeprazole  (PRILOSEC) 20 MG capsule Take 40 mg by mouth daily.    [provider]  traMADol (ULTRAM) 50 MG tablet Take 1 tablet (50 mg total) by mouth every 6 (six) hours as needed (pAIN). 10/21/19   LampteyBritta Mccreedy, Philip O, MD    Family History Family History  Problem Relation Age of Onset  . Healthy Mother     Social History Social History   Tobacco Use  . Smoking status: Former Smoker    Types: Cigarettes  . Smokeless tobacco: Never Used  Substance Use Topics  . Alcohol use: Yes    Alcohol/week: 42.0 standard drinks    Types: 42 Cans of beer per week    Comment: " i try to stay drunk to make the pain go away"  . Drug use: Not on file     Allergies   Patient has no known allergies.   Review of Systems Review of Systems  Constitutional: Positive for activity change. Negative for chills, diaphoresis and fever.  Respiratory: Negative.   Gastrointestinal: Negative.   Genitourinary: Negative.   Musculoskeletal: Positive for arthralgias. Negative for joint swelling and myalgias.  Skin: Negative.   Neurological: Negative.      Physical Exam Triage Vital Signs ED Triage Vitals  Enc Vitals Group     BP 10/21/19 1430 (!) 170/89  Pulse Rate 10/21/19 1430 65     Resp 10/21/19 1430 16     Temp 10/21/19 1430 98.2 F (36.8 C)     Temp Source 10/21/19 1430 Oral     SpO2 10/21/19 1430 100 %     Weight --      Height --      Head Circumference --      Peak Flow --      Pain Score 10/21/19 1419 10     Pain Loc --      Pain Edu? --      Excl. in [place]? --    No data found.  Updated Vital Signs BP (!) 170/89 (BP Location: Left Arm)   Pulse 65   Temp 98.2 F (36.8 C) (Oral)   Resp 16   SpO2 100%   Visual Acuity Right Eye Distance:   Left Eye Distance:   Bilateral Distance:    Right Eye Near:   Left Eye Near:    Bilateral Near:     Physical Exam Vitals signs and nursing note reviewed.  Constitutional:      General: He is in acute distress.     Appearance: He is  ill-appearing. He is not toxic-appearing.  Cardiovascular:     Pulses: Normal pulses.  Pulmonary:     Effort: Pulmonary effort is normal.     Breath sounds: Normal breath sounds.  Abdominal:     General: Bowel sounds are normal.     Palpations: Abdomen is soft.  Musculoskeletal:     Comments: Decreased abduction and abduction of the right shoulder.  No visible atrophy of the right shoulder muscles.  Patient has tenderness in the anterior aspect of the rotator cuff.  Limited range of motion.  Skin:    Capillary Refill: Capillary refill takes less than 2 seconds.  Neurological:     General: No focal deficit present.     Mental Status: He is alert. Mental status is at baseline.      UC Treatments / Results  Labs (all labs ordered are listed, but only abnormal results are displayed) Labs Reviewed - No data to display  EKG   Radiology No results found.  Procedures Procedures (including critical care time)  Medications Ordered in UC Medications  ketorolac (TORADOL) 30 MG/ML injection 30 mg (has no administration in time range)    Initial Impression / Assessment and Plan / UC Course  I have reviewed the triage vital signs and the nursing notes.  Pertinent labs & imaging results that were available during my care of the patient were reviewed by me and considered in my medical decision making (see chart for details).     1.  Right shoulder pain likely rotator cuff injury versus adhesive capsulitis. Naproxen 375 mg twice daily Tramadol 50 mg every 6 hours as needed for pain Patient will benefit from orthopedic evaluation.  Information about orthopedic surgeon was given to the patient.  Patient will require shoulder joint steroid injection and probably further imaging to assess rotator cuff.  If patient's pain becomes unbearable he is advised to go to emergency department to be evaluated. Final Clinical Impressions(s) / UC Diagnoses   Final diagnoses:  Rotator cuff injury,  right, subsequent encounter   Discharge Instructions   None    ED Prescriptions    Medication Sig Dispense Auth. Provider   traMADol (ULTRAM) 50 MG tablet Take 1 tablet (50 mg total) by mouth every 6 (six) hours as needed (pAIN). 12 tablet  Merrilee Jansky, MD   naproxen (NAPROSYN) 375 MG tablet Take 1 tablet (375 mg total) by mouth 2 (two) times daily. 40 tablet Lamptey, Britta Mccreedy, MD     I have reviewed the PDMP during this encounter.   Merrilee Jansky, MD 10/21/19 1740

## 2019-10-25 ENCOUNTER — Encounter: Payer: Self-pay | Admitting: Registered Nurse

## 2019-11-03 ENCOUNTER — Other Ambulatory Visit: Payer: Self-pay | Admitting: Orthopedic Surgery

## 2019-11-03 DIAGNOSIS — M25511 Pain in right shoulder: Secondary | ICD-10-CM

## 2019-11-29 ENCOUNTER — Other Ambulatory Visit: Payer: Medicare Other

## 2019-12-15 ENCOUNTER — Encounter: Payer: Self-pay | Admitting: Family Medicine

## 2019-12-15 ENCOUNTER — Ambulatory Visit: Payer: Medicare Other | Attending: Family Medicine | Admitting: Family Medicine

## 2019-12-15 ENCOUNTER — Other Ambulatory Visit: Payer: Self-pay

## 2019-12-15 DIAGNOSIS — M25511 Pain in right shoulder: Secondary | ICD-10-CM | POA: Diagnosis not present

## 2019-12-15 DIAGNOSIS — E041 Nontoxic single thyroid nodule: Secondary | ICD-10-CM

## 2019-12-15 DIAGNOSIS — G8929 Other chronic pain: Secondary | ICD-10-CM

## 2019-12-15 NOTE — Progress Notes (Signed)
Patient verified DOB Patient has eaten today. Patient has not taken medication today. Patient complains of pain in the right shoulder for the past 3 months, pain is described as aching. Patient had a fall 3 months ago and dislocated the shoulder.

## 2019-12-15 NOTE — Progress Notes (Signed)
Virtual Visit via Telephone Note  I connected with Gerald Bradley on 12/15/19 at  2:30 PM EST by telephone and verified that I am speaking with the correct person using two identifiers.   I discussed the limitations, risks, security and privacy concerns of performing an evaluation and management service by telephone and the availability of in person appointments. I also discussed with the patient that there may be a patient responsible charge related to this service. The patient expressed understanding and agreed to proceed.  Patient Location: Home Provider Location: [facility] Others participating in call: none   History of Present Illness:         63 yo male who reports that has been having chronic issues with right shoulder pain and he has seen Orthopedics and is now in physical therapy.  Earlier this year, patient fell and tried to catch himself with his outstretched right arm which led to shoulder dislocation.  He reports that he tried to reduce his shoulder himself as this had happened in the past.  He continues to have recurrent pain in his shoulder area and presented to the emergency department in late October and he reports that he was subsequently referred to orthopedics.  He has been treated by orthopedics and also referred to physical therapy however he is getting no improvement in his right shoulder pain.  Pain is sharp and sometimes stabbing.  He occasionally gets throbbing pain in his shoulder if he rolls over onto his shoulder during sleep.  Pain is around an 8 on a 0-to-10 scale and pain is greater if he moves his arm or tries to hold any objects in the right hand.  He does not have any issues with numbness or tingling in the right hand or fingers.  He would like to be referred to pain medicine to see if there are any other options for treatment of his chronic right shoulder pain.         He reports no other significant medical issues but states that he believes that he was told by  his prior doctor that he needed follow-up of a thyroid nodule.  Patient reports that he moved to the area this summer and previously lived in the [place] area.  He denies any current issues with increased heart rate or palpitations, no issues with peripheral edema, no excessive fatigue, no constipation or diarrhea.  He would like to have ultrasound scheduled in follow-up with thyroid nodule.  Past Medical History:  Diagnosis Date  . Arthritis   . Bradycardia   . Rectal bleeding    Hx of    Past Surgical History:  Procedure Laterality Date  . LUNG SURGERY     resection for spontaneous pneumothorax skin graft    Family History  Problem Relation Age of Onset  . Healthy Mother     Social History   Tobacco Use  . Smoking status: Former Smoker    Types: Cigarettes  . Smokeless tobacco: Never Used  Substance Use Topics  . Alcohol use: Yes    Alcohol/week: 42.0 standard drinks    Types: 42 Cans of beer per week    Comment: " i try to stay drunk to make the pain go away"  . Drug use: Not on file     No Known Allergies     Observations/Objective: No vital signs or physical exam conducted as visit was done via telephone  Assessment and Plan: 1. Chronic right shoulder pain Patient with chronic right shoulder  pain status post fall with dislocation of the right shoulder in September of this year for which he was seen at the emergency department x2 on 09/28/2019 and 10/05/2019 and subsequently referred to orthopedics.  Patient was seen and treated by [facility] and reports that he also attended physical therapy but so far has gotten no relief of his chronic shoulder pain.  Patient will be referred to pain medicine for further evaluation and treatment -Ambulatory referral to Pain Clinic  2. Right thyroid nodule He reports history of a right thyroid nodule and he will be scheduled for thyroid ultrasound and follow-up.  He will be notified of the results and if any further  interventions are needed based on the results of the thyroid ultrasound. - US THYROID; Future  Follow Up Instructions:Return in about 5 weeks (around 01/19/2020).    I discussed the assessment and treatment plan with the patient. The patient was provided an opportunity to ask questions and all were answered. The patient agreed with the plan and demonstrated an understanding of the instructions.   The patient was advised to call back or seek an in-person evaluation if the symptoms worsen or if the condition fails to improve as anticipated.  I provided 15 minutes of non-face-to-face time during this encounter.   Antony Blackbird, MD

## 2019-12-16 ENCOUNTER — Other Ambulatory Visit: Payer: Self-pay

## 2019-12-16 ENCOUNTER — Ambulatory Visit: Payer: Medicare Other | Attending: Family Medicine | Admitting: Pharmacist

## 2019-12-16 DIAGNOSIS — Z23 Encounter for immunization: Secondary | ICD-10-CM

## 2019-12-16 NOTE — Progress Notes (Signed)
Patient presents for vaccination against influenza and tetanus per orders of Dr. Fulp. Consent given. Counseling provided. No contraindications exists. Vaccine administered without incident.   

## 2020-08-29 ENCOUNTER — Other Ambulatory Visit: Payer: Self-pay

## 2020-08-29 ENCOUNTER — Encounter (HOSPITAL_COMMUNITY): Payer: Self-pay | Admitting: Emergency Medicine

## 2020-08-29 ENCOUNTER — Ambulatory Visit (HOSPITAL_COMMUNITY)
Admission: EM | Admit: 2020-08-29 | Discharge: 2020-08-29 | Disposition: A | Discharge status: Home / Self Care | Payer: Medicare Other | Attending: Family Medicine | Admitting: Family Medicine

## 2020-08-29 DIAGNOSIS — M109 Gout, unspecified: Secondary | ICD-10-CM

## 2020-08-29 DIAGNOSIS — F1090 Alcohol use, unspecified, uncomplicated: Secondary | ICD-10-CM

## 2020-08-29 MED ORDER — PREDNISONE 20 MG PO TABS
20.0000 mg | ORAL_TABLET | Freq: Once | ORAL | Status: DC
Start: 2020-08-29 — End: 2020-08-29

## 2020-08-29 MED ORDER — METHYLPREDNISOLONE 4 MG PO TBPK: ORAL_TABLET | ORAL | 0 refills | Status: DC

## 2020-08-29 MED ORDER — HYDROCODONE-ACETAMINOPHEN 5-325 MG PO TABS
2.0000 | ORAL_TABLET | Freq: Once | ORAL | Status: AC
  Administered 2020-08-29: 2 via ORAL

## 2020-08-29 MED ORDER — HYDROCODONE-ACETAMINOPHEN 5-325 MG PO TABS
ORAL_TABLET | ORAL | Status: AC
  Filled 2020-08-29: qty 2

## 2020-08-29 NOTE — ED Triage Notes (Signed)
right foot pain for 3 days, denies injury.  Right foot is swollen, painful, and red

## 2020-08-29 NOTE — ED Provider Notes (Signed)
[facility]    CSN: [id number] Arrival date & time: 08/29/20  1122      History   Chief Complaint Chief Complaint  Patient presents with   Foot Pain    HPI Gerald Bradley is a 64 y.o. male.   HPI\  64 year old gentleman.  Alcoholic.  On chronic pain management, receives oxycodone monthly basis.  He is here for foot pain.  No trauma.  He has a past history of gout.  His right foot is swollen, red, and very painful.  He drinks beer on a daily basis.  States he is also been eating more meat and fried foods.  He is unaware of a gout diet.   Past Medical History:  Diagnosis Date   Arthritis    Bradycardia    Rectal bleeding    Hx of    Patient Active Problem List   Diagnosis Date Noted   Heavy alcohol use 08/29/2020   Gout 08/29/2020   Cardiomyopathy 04/19/2012   HYPERCHOLESTEROLEMIA 08/23/2009   BRADYCARDIA 07/06/2009   ARTHRITIS 07/06/2009   SHORTNESS OF BREATH 07/06/2009   RECTAL BLEEDING, HX OF 07/06/2009    Past Surgical History:  Procedure Laterality Date   LUNG SURGERY     resection for spontaneous pneumothorax skin graft       Home Medications    Prior to Admission medications   Medication Sig Start Date End Date Taking? Authorizing Provider  methylPREDNISolone (MEDROL DOSEPAK) 4 MG TBPK tablet tad 08/29/20   Eustace Moore, MD  omeprazole (PRILOSEC) 20 MG capsule Take 40 mg by mouth daily.  08/29/20  [provider]    Family History Family History  Problem Relation Age of Onset   Healthy Mother     Social History Social History   Tobacco Use   Smoking status: Former Smoker    Types: Cigarettes   Smokeless tobacco: Never Used  Building services engineer Use: Never used  Substance Use Topics   Alcohol use: Yes    Alcohol/week: 42.0 standard drinks    Types: 42 Cans of beer per week    Comment: " i try to stay drunk to make the pain go away"   Drug use: Not Currently     Allergies    Iohexol   Review of Systems Review of Systems See HPI  Physical Exam Triage Vital Signs ED Triage Vitals  Enc Vitals Group     BP 08/29/20 1402 113/84     Pulse Rate 08/29/20 1402 68     Resp 08/29/20 1402 (!) 24     Temp 08/29/20 1402 98.1 F (36.7 C)     Temp Source 08/29/20 1402 Oral     SpO2 08/29/20 1402 100 %     Weight --      Height --      Head Circumference --      Peak Flow --      Pain Score 08/29/20 1358 10     Pain Loc --      Pain Edu? --      Excl. in GC? --    No data found.  Updated Vital Signs BP 113/84 (BP Location: Right Arm)    Pulse 68    Temp 98.1 F (36.7 C) (Oral)    Resp (!) 24    SpO2 100%     Physical Exam Constitutional:      General: He is in acute distress.     Appearance: He is well-developed.  Comments: Appears uncomfortable  HENT:     Head: Normocephalic and atraumatic.     Mouth/Throat:     Comments: Mask is in place Eyes:     Conjunctiva/sclera: Conjunctivae normal.     Pupils: Pupils are equal, round, and reactive to light.  Cardiovascular:     Rate and Rhythm: Normal rate.  Pulmonary:     Effort: Pulmonary effort is normal. No respiratory distress.  Musculoskeletal:        General: Normal range of motion.     Cervical back: Normal range of motion.     Comments: Right foot has swelling, redness, and pain around the first MTP.  Acutely painful with even light pressure.  Skin:    General: Skin is warm and dry.  Neurological:     Mental Status: He is alert.  Psychiatric:        Mood and Affect: Mood normal.        Behavior: Behavior normal.      UC Treatments / Results  Labs (all labs ordered are listed, but only abnormal results are displayed) Labs Reviewed - No data to display  EKG   Radiology No results found.  Procedures Procedures (including critical care time)  Medications Ordered in UC Medications  HYDROcodone-acetaminophen (NORCO/VICODIN) 5-325 MG per tablet 2 tablet (has no administration  in time range)    Initial Impression / Assessment and Plan / UC Course  I have reviewed the triage vital signs and the nursing notes.  Pertinent labs & imaging results that were available during my care of the patient were reviewed by me and considered in my medical decision making (see chart for details).      Final Clinical Impressions(s) / UC Diagnoses   Final diagnoses:  Acute gout involving toe of right foot, unspecified cause     Discharge Instructions     You need to take the Medrol Dosepak to reduce inflammation and pain in your foot Follow the package instructions You have pain medicine at home to take.  Chart review indicates you get 90 Roxicodone a month.  This should help with your pain Stay off of your feet and elevate foot See your orthopedic in follow-up Read the enclosed papers on what foods to avoid for gout   ED Prescriptions    Medication Sig Dispense Auth. Provider   methylPREDNISolone (MEDROL DOSEPAK) 4 MG TBPK tablet tad 21 tablet Eustace Moore, MD     I have reviewed the PDMP during this encounter.   Eustace Moore, MD 08/29/20 [phone]

## 2020-08-29 NOTE — Discharge Instructions (Addendum)
You need to take the Medrol Dosepak to reduce inflammation and pain in your foot Follow the package instructions You have pain medicine at home to take.  Chart review indicates you get 90 Roxicodone a month.  This should help with your pain Stay off of your feet and elevate foot See your orthopedic in follow-up Read the enclosed papers on what foods to avoid for gout

## 2020-08-31 ENCOUNTER — Telehealth: Payer: Self-pay

## 2020-08-31 NOTE — Telephone Encounter (Signed)
NOTES ON FILE FROM [facility] [phone], SENT REFERRAL TO SCHEDULING 

## 2020-10-08 NOTE — Progress Notes (Signed)
Cardiology Office Note:    Date:  10/10/2020   ID:  Gerald Bradley, DOB 1956-06-29, MRN [id number]  PCP:  Gerald Saupe, MD  [facility] Cardiologist:  No primary care provider on file.  [facility] Electrophysiologist:  None   Referring MD: Gerald Ito, DO    History of Present Illness:    Gerald Bradley is a 64 y.o. 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   Patient states that he overall feels well. He is active despite his right hip pain and is able to walk a mile without SOB or chest pain. No orthopnea, PND, LE edema. Able to walk a flight of stairs without needing to stop. No changes in his exercise capacity over the past couple of years; just reports more pain with ambulation.   Blood pressure today 132/70. Does not monitor at home. HR in 40s not on medications. HR augments with exercise.   Family history: no known family history of heart disease. ASCVD risk 18.4%.   TTE in 2013 with mildly reduced LVEF 45-50%, no WMA, no valvular pathology Stress TTE in 2013: Normal without ischemia  TC 198, HDL 46, LDL 111, Na 141, K 4.8, AST 22, ALT 18, HgB 17.7, Plt 238, TSH 1.0, T4 1.0  Past Medical History:  Diagnosis Date  . Alcohol dependence (HCC)   . Arthritis   . Bradycardia   . Cannabis abuse   . Cardiomyopathy (HCC)   . Fatty liver   . History of pneumothorax   . History of rectal bleeding   . Homelessness   . Hypercholesterolemia   . Nicotine dependence   . Osteoarthritis   . Overweight   . Rectal bleeding    Hx of  . SOB (shortness of breath)   . Toe pain, right   . Trapezius muscle spasm     Past Surgical History:  Procedure Laterality Date  . LUNG SURGERY     resection for spontaneous pneumothorax skin graft  . SKIN SURGERY      Current  Medications: Current Meds  Medication Sig  . baclofen (LIORESAL) 10 MG tablet Take 10 mg by mouth 3 (three) times daily.  . celecoxib (CELEBREX) 200 MG capsule Take 200 mg by mouth 2 (two) times daily.  Marland Kitchen oxycodone (OXY-IR) 5 MG capsule Take 5 mg by mouth every 4 (four) hours as needed.  . thiamine (VITAMIN B-1) 100 MG tablet Take 100 mg by mouth daily.     Allergies:   Iohexol   Social History   Socioeconomic History  . Marital status: Single    Spouse name: Not on file  . Number of children: Not on file  . Years of education: Not on file  . Highest education level: Not on file  Occupational History  . Not on file  Tobacco Use  . Smoking status: Former Smoker    Types: Cigarettes  . Smokeless tobacco: Never Used  Vaping Use  . Vaping Use: Never used  Substance and Sexual Activity  . Alcohol use: Yes    Alcohol/week: 42.0 standard drinks    Types: 42 Cans of beer per week    Comment: " i try to stay drunk to make the pain go away"  . Drug use: Not Currently  . Sexual activity: Not Currently  Other Topics  Concern  . Not on file  Social History Narrative  . Not on file   Social Determinants of Health   Financial Resource Strain:   . Difficulty of Paying Living Expenses: Not on file  Food Insecurity:   . Worried About Programme researcher, broadcasting/film/video in the Last Year: Not on file  . Ran Out of Food in the Last Year: Not on file  Transportation Needs:   . Lack of Transportation (Medical): Not on file  . Lack of Transportation (Non-Medical): Not on file  Physical Activity:   . Days of Exercise per Week: Not on file  . Minutes of Exercise per Session: Not on file  Stress:   . Feeling of Stress : Not on file  Social Connections:   . Frequency of Communication with Friends and Family: Not on file  . Frequency of Social Gatherings with Friends and Family: Not on file  . Attends Religious Services: Not on file  . Active Member of Clubs or Organizations: Not on file  . Attends Occupational hygienist Meetings: Not on file  . Marital Status: Not on file     Family History: The patient's family history includes Healthy in his mother.  ROS:   Please see the history of present illness.    The patient denies chest pain, chest pressure, dyspnea at rest or with exertion, palpitations, PND, orthopnea, or leg swelling. Denies cough, fever, chills. Denies nausea, vomiting. Denies syncope or presyncope. Denies dizziness or lightheadedness. Denies snoring.  EKGs/Labs/Other Studies Reviewed:    The following studies were reviewed today: TTE 2012/04/19: Study Conclusions   - Left ventricle: The cavity size was normal. Wall thickness  was at the upper limits of normal. Systolic function was  mildly reduced. The estimated ejection fraction was in the  range of 45% to 50%. Diffuse hypokinesis. Doppler  parameters are consistent with abnormal left ventricular  relaxation (grade 1 diastolic dysfunction).  - Atrial septum: No defect or patent foramen ovale was  identified.   Stress echo 04-19-12: Study Conclusions  - Stress ECG conclusions: There were no stress arrhythmias or conduction abnormalities. The stress ECG was negative  for ischemia.  - Staged echo: There was no echocardiographic evidence for stress-induced ischemia.  - Impressions: Normal stress echo However the apical stress  images appear to have been captured at lower HR;s  Impressions:  Normal stress echo However the apical stress images appear to have been captured at lower HR;s   ECG April 19, 2012: Sinus bradycardia with LVH   EKG:  EKG is ordered today.  The ekg ordered today demonstrates sinus bradycardia. No block. No ischemia. Borderline LVH.  Recent Labs: No results found for requested labs within last 8760 hours.  Recent Lipid Panel    Component Value Date/Time   CHOL 212 (H) 08/23/2009 0951   TRIG 163.0 (H) 08/23/2009 0951   HDL 66.00 08/23/2009 0951   CHOLHDL 3 08/23/2009 0951   VLDL 32.6  08/23/2009 0951   LDLCALC 122 (H) 05/09/2008 2018   LDLDIRECT 120.1 08/23/2009 0951      Physical Exam:    VS:  BP 132/70   Pulse (!) 47   Ht 5\' 9"  (1.753 m)   Wt 166 lb (75.3 kg)   SpO2 97%   BMI 24.51 kg/m     Wt Readings from Last 3 Encounters:  10/10/20 166 lb (75.3 kg)  09/28/19 160 lb (72.6 kg)  05/19/12 157 lb 1.9 oz (71.3 kg)     GEN:  Well nourished, well developed in no acute distress HEENT: Normal NECK: No JVD; No carotid bruits LYMPHATICS: No lymphadenopathy CARDIAC: Bradycardic, regular, no murmurs, rubs, gallops RESPIRATORY:  Clear to auscultation without rales, wheezing or rhonchi  ABDOMEN: Soft, non-tender, non-distended MUSCULOSKELETAL:  No edema; No deformity  SKIN: Warm and dry NEUROLOGIC:  Alert and oriented x 3 PSYCHIATRIC:  Normal affect   ASSESSMENT:    1. Cardiomyopathy, unspecified type (HCC)   2. HYPERCHOLESTEROLEMIA   3. Shortness of breath   4. Cardiomyopathy, ischemic    PLAN:    In order of problems listed above:  #Pre-operative evaluation prior to right total hip replacement: Last stress TTE in 2013 normal. Patient is active and able to perform >4METs without chest pain, SOB or DOE. Given good functional capacity without symptoms, do not need stress testing at this time. Will obtain TTE, however, given mildly reduced LVEF on last TTE in 2013. -Patient asymptomatic with good functional capacity and therefore stress testing not needed at this time  -Check TTE given mildly reduced LVEF in 2013; asymptomatic and dry on exam -No beta blocker as HR 40s -May consider ACE/ARB pending TTE findings -Start crestor 10mg , ASCVD risk score 18.2% and active smoker  #Cardiomyopathy with LVEF 45-50%: Compensated and euvolemic. Last TTE in 2013 with LVEF 45-50% without regional WMA.  -Repeat TTE -Pending TTE, may consider ACE/ARB  -No beta blocker given HR 40s  #Hyperlipidemia: ASCVD 18.2% with current tobacco use. Discussed risk versus  benefits of statin therapy and patient wishes to pursue treatment at this time to lower CV risk. -Start Crestor 10mg  daily -Repeat labs in 3 months   Medication Adjustments/Labs and Tests Ordered: Current medicines are reviewed at length with the patient today.  Concerns regarding medicines are outlined above.  Orders Placed This Encounter  Procedures  . Lipid panel  . Hepatic function panel  . EKG 12-Lead  . ECHOCARDIOGRAM COMPLETE   Meds ordered this encounter  Medications  . rosuvastatin (CRESTOR) 10 MG tablet    Sig: Take 1 tablet (10 mg total) by mouth daily.    Dispense:  90 tablet    Refill:  3    Patient Instructions  Medication Instructions:   START TAKING ROSUVASTATIN( CRESTOR) 10 MG ONCE A  DAY    *If you need a refill on your cardiac medications before your next appointment, please call your pharmacy*   Lab Work:  RETURN FOR FASTING  LIPID  AND LIVER PANEL OR GET FROM PRIMARY CARE PROVIDER   If you have labs (blood work) drawn today and your tests are completely normal, you will receive your results only by: Marland Kitchen. MyChart Message (if you have MyChart) OR . A paper copy in the mail If you have any lab test that is abnormal or we need to change your treatment, we will call you to review the results.   Testing/Procedures: Your physician has requested that you have an echocardiogram. Echocardiography is a painless test that uses sound waves to create images of your heart. It provides your doctor with information about the size and shape of your heart and how well your heart's chambers and valves are working. This procedure takes approximately one hour. There are no restrictions for this procedure.   Follow-Up: At [facility], you and your health needs are our priority.  As part of our continuing mission to provide you with exceptional heart care, we have created designated Provider Care Teams.  These Care Teams include your primary Cardiologist (physician) and  Advanced Practice Providers (APPs -  Physician Assistants and Nurse Practitioners) who all work together to provide you with the care you need, when you need it.  We recommend signing up for the patient portal called "MyChart".  Sign up information is provided on this After Visit Summary.  MyChart is used to connect with patients for Virtual Visits (Telemedicine).  Patients are able to view lab/test results, encounter notes, upcoming appointments, etc.  Non-urgent messages can be sent to your provider as well.   To learn more about what you can do with MyChart, go to [email or web address].    Your next appointment:   6 month(s)  The format for your next appointment:   In Person  Provider:   Laurance Flatten, MD   Other Instructions      Signed, Meriam Sprague, MD  10/10/2020 12:06 PM    [facility]

## 2020-10-10 ENCOUNTER — Ambulatory Visit (INDEPENDENT_AMBULATORY_CARE_PROVIDER_SITE_OTHER): Payer: Medicare Other | Admitting: Cardiology

## 2020-10-10 ENCOUNTER — Other Ambulatory Visit: Payer: Self-pay

## 2020-10-10 ENCOUNTER — Encounter: Payer: Self-pay | Admitting: Cardiology

## 2020-10-10 VITALS — BP 132/70 | HR 47 | Ht 69.0 in | Wt 166.0 lb

## 2020-10-10 DIAGNOSIS — E7800 Pure hypercholesterolemia, unspecified: Secondary | ICD-10-CM

## 2020-10-10 DIAGNOSIS — I429 Cardiomyopathy, unspecified: Secondary | ICD-10-CM

## 2020-10-10 DIAGNOSIS — R0602 Shortness of breath: Secondary | ICD-10-CM

## 2020-10-10 DIAGNOSIS — I255 Ischemic cardiomyopathy: Secondary | ICD-10-CM

## 2020-10-10 MED ORDER — ROSUVASTATIN CALCIUM 10 MG PO TABS: 10.0000 mg | ORAL_TABLET | Freq: Every day | ORAL | 3 refills | Status: DC

## 2020-10-10 NOTE — Patient Instructions (Addendum)
Medication Instructions:   START TAKING ROSUVASTATIN( CRESTOR) 10 MG ONCE A  DAY    *If you need a refill on your cardiac medications before your next appointment, please call your pharmacy*   Lab Work:  RETURN FOR FASTING  LIPID  AND LIVER PANEL OR GET FROM PRIMARY CARE PROVIDER   If you have labs (blood work) drawn today and your tests are completely normal, you will receive your results only by: Marland Kitchen MyChart Message (if you have MyChart) OR . A paper copy in the mail If you have any lab test that is abnormal or we need to change your treatment, we will call you to review the results.   Testing/Procedures: Your physician has requested that you have an echocardiogram. Echocardiography is a painless test that uses sound waves to create images of your heart. It provides your doctor with information about the size and shape of your heart and how well your heart's chambers and valves are working. This procedure takes approximately one hour. There are no restrictions for this procedure.   Follow-Up: At [facility], you and your health needs are our priority.  As part of our continuing mission to provide you with exceptional heart care, we have created designated Provider Care Teams.  These Care Teams include your primary Cardiologist (physician) and Advanced Practice Providers (APPs -  Physician Assistants and Nurse Practitioners) who all work together to provide you with the care you need, when you need it.  We recommend signing up for the patient portal called "MyChart".  Sign up information is provided on this After Visit Summary.  MyChart is used to connect with patients for Virtual Visits (Telemedicine).  Patients are able to view lab/test results, encounter notes, upcoming appointments, etc.  Non-urgent messages can be sent to your provider as well.   To learn more about what you can do with MyChart, go to [email or web address].    Your next appointment:   6 month(s)  The format for  your next appointment:   In Person  Provider:   Laurance Flatten, MD   Other Instructions

## 2020-10-31 ENCOUNTER — Other Ambulatory Visit: Payer: Medicare Other | Admitting: *Deleted

## 2020-10-31 ENCOUNTER — Ambulatory Visit (HOSPITAL_COMMUNITY): Payer: Medicare Other | Attending: Cardiovascular Disease

## 2020-10-31 ENCOUNTER — Other Ambulatory Visit: Payer: Self-pay

## 2020-10-31 DIAGNOSIS — I255 Ischemic cardiomyopathy: Secondary | ICD-10-CM | POA: Diagnosis present

## 2020-10-31 DIAGNOSIS — E7800 Pure hypercholesterolemia, unspecified: Secondary | ICD-10-CM

## 2020-10-31 LAB — HEPATIC FUNCTION PANEL
ALT: 22 IU/L (ref 0–44)
AST: 21 IU/L (ref 0–40)
Albumin: 4.7 g/dL (ref 3.8–4.8)
Alkaline Phosphatase: 62 IU/L (ref 44–121)
Bilirubin Total: 1.1 mg/dL (ref 0.0–1.2)
Bilirubin, Direct: 0.29 mg/dL (ref 0.00–0.40)
Total Protein: 7.9 g/dL (ref 6.0–8.5)

## 2020-10-31 LAB — LIPID PANEL
Chol/HDL Ratio: 3.3 ratio (ref 0.0–5.0)
Cholesterol, Total: 176 mg/dL (ref 100–199)
HDL: 54 mg/dL (ref >39)
LDL Chol Calc (NIH): 83 mg/dL (ref 0–99)
Triglycerides: 239 mg/dL — ABNORMAL HIGH (ref 0–149)
VLDL Cholesterol Cal: 39 mg/dL (ref 5–40)

## 2020-10-31 LAB — ECHOCARDIOGRAM COMPLETE
Area-P 1/2: 2.87 cm2
S' Lateral: 3.4 cm

## 2020-11-01 ENCOUNTER — Telehealth: Payer: Self-pay

## 2020-11-01 ENCOUNTER — Telehealth: Payer: Self-pay | Admitting: *Deleted

## 2020-11-01 DIAGNOSIS — Z79899 Other long term (current) drug therapy: Secondary | ICD-10-CM

## 2020-11-01 DIAGNOSIS — I429 Cardiomyopathy, unspecified: Secondary | ICD-10-CM

## 2020-11-01 MED ORDER — LOSARTAN POTASSIUM 25 MG PO TABS: 25.0000 mg | ORAL_TABLET | Freq: Every day | ORAL | 6 refills | Status: DC

## 2020-11-01 NOTE — Telephone Encounter (Signed)
The patient has been notified of the result and verbalized understanding.  All questions (if any) were answered. Leanord Hawking, RN 11/01/2020 12:50 PM

## 2020-11-01 NOTE — Telephone Encounter (Signed)
-----   Message from Meriam Sprague, MD sent at 11/01/2020  8:07 AM EDT ----- Echo shows that the EF is slightly improved from prior tests at 50-55%. He has grade II diastolic dysfunction and moderate LA enlargement. We will start him on losartan 25mg  and repeat BMET in 1 week. He is medically cleared to undergo his hip surgery.   Thank you so much!

## 2020-11-01 NOTE — Telephone Encounter (Signed)
-----   Message from Meriam Sprague, MD sent at 11/01/2020 12:40 PM EDT ----- LDL looks good. Need to watch his triglycerides but no need to add additional medication at this time. Continue crestor and we can repeat labs after next visit.   Thank you!!

## 2020-11-01 NOTE — Telephone Encounter (Signed)
Informed patient of results and verbal understanding expressed. Agreeable to starting new medication, education given. Pt will stop by the office next Tuesday for blood work.

## 2020-11-07 ENCOUNTER — Other Ambulatory Visit: Payer: Self-pay

## 2020-11-07 ENCOUNTER — Other Ambulatory Visit: Payer: Medicare Other

## 2020-11-07 DIAGNOSIS — Z79899 Other long term (current) drug therapy: Secondary | ICD-10-CM

## 2020-11-07 DIAGNOSIS — I429 Cardiomyopathy, unspecified: Secondary | ICD-10-CM

## 2020-11-07 LAB — BASIC METABOLIC PANEL
BUN/Creatinine Ratio: 12 (ref 10–24)
BUN: 10 mg/dL (ref 8–27)
CO2: 21 mmol/L (ref 20–29)
Calcium: 9.5 mg/dL (ref 8.6–10.2)
Chloride: 105 mmol/L (ref 96–106)
Creatinine, Ser: 0.85 mg/dL (ref 0.76–1.27)
GFR calc Af Amer: 105 mL/min/{1.73_m2} (ref >59)
GFR calc non Af Amer: 91 mL/min/{1.73_m2} (ref >59)
Glucose: 89 mg/dL (ref 65–99)
Potassium: 4.2 mmol/L (ref 3.5–5.2)
Sodium: 142 mmol/L (ref 134–144)

## 2021-04-12 ENCOUNTER — Other Ambulatory Visit: Payer: Self-pay

## 2021-04-12 MED ORDER — LOSARTAN POTASSIUM 25 MG PO TABS: 25.0000 mg | ORAL_TABLET | Freq: Every day | ORAL | 5 refills | Status: AC

## 2021-05-31 ENCOUNTER — Ambulatory Visit
Admission: RE | Admit: 2021-05-31 | Discharge: 2021-05-31 | Disposition: A | Discharge status: Home / Self Care | Payer: Medicare Other | Source: Ambulatory Visit | Attending: Student | Admitting: Student

## 2021-05-31 ENCOUNTER — Other Ambulatory Visit: Payer: Self-pay | Admitting: Student

## 2021-05-31 DIAGNOSIS — S6991XS Unspecified injury of right wrist, hand and finger(s), sequela: Secondary | ICD-10-CM

## 2021-05-31 DIAGNOSIS — M79641 Pain in right hand: Secondary | ICD-10-CM

## 2021-07-05 ENCOUNTER — Encounter (HOSPITAL_COMMUNITY): Payer: Self-pay

## 2021-07-05 ENCOUNTER — Other Ambulatory Visit: Payer: Self-pay

## 2021-07-05 ENCOUNTER — Ambulatory Visit (HOSPITAL_COMMUNITY)
Admission: EM | Admit: 2021-07-05 | Discharge: 2021-07-05 | Disposition: A | Discharge status: Home / Self Care | Payer: Medicare Other

## 2021-07-05 DIAGNOSIS — S0502XA Injury of conjunctiva and corneal abrasion without foreign body, left eye, initial encounter: Secondary | ICD-10-CM

## 2021-07-05 MED ORDER — POLYMYXIN B-TRIMETHOPRIM 10000-0.1 UNIT/ML-% OP SOLN: 1.0000 [drp] | OPHTHALMIC | 0 refills | Status: DC

## 2021-07-05 NOTE — ED Provider Notes (Signed)
[facility]    CSN: [id number] Arrival date & time: 07/05/21  0940      History   Chief Complaint Chief Complaint  Patient presents with   Left Eye Irritation    HPI Gerald Bradley is a 65 y.o. male presenting with L eye irritation x3 days.  Medical history cardiomyopathy, pneumothorax, rectal bleeding, homelessness, alcohol dependence.  States his left eye was itchy 3 days ago's, so he rubbed it and ended up scratching it with one of his nails.  Since then he has had foreign body sensation, redness, crusting in the morning, photophobia, burning, pain particularly with eye movement.  Endorses blurred vision in the left eye.  States symptoms have not improved at all.  He should wear glasses, but has lost his prescription per patient.  Does not wear contacts.  No recent URI.  HPI  Past Medical History:  Diagnosis Date   Alcohol dependence (HCC)    Arthritis    Bradycardia    Cannabis abuse    Cardiomyopathy (HCC)    Fatty liver    History of pneumothorax    History of rectal bleeding    Homelessness    Hypercholesterolemia    Nicotine dependence    Osteoarthritis    Overweight    Rectal bleeding    Hx of   SOB (shortness of breath)    Toe pain, right    Trapezius muscle spasm     Patient Active Problem List   Diagnosis Date Noted   Heavy alcohol use 08/29/2020   Gout 08/29/2020   Cardiomyopathy 04/19/2012   HYPERCHOLESTEROLEMIA 08/23/2009   BRADYCARDIA 07/06/2009   ARTHRITIS 07/06/2009   SHORTNESS OF BREATH 07/06/2009   RECTAL BLEEDING, HX OF 07/06/2009    Past Surgical History:  Procedure Laterality Date   LUNG SURGERY     resection for spontaneous pneumothorax skin graft   SKIN SURGERY         Home Medications    Prior to Admission medications   Medication Sig Start Date End Date Taking? Authorizing Provider  allopurinol (ZYLOPRIM) 100 MG tablet Take 100 mg by mouth 2 (two) times daily.   Yes [provider]  baclofen (LIORESAL)  10 MG tablet Take 10 mg by mouth 3 (three) times daily.   Yes [provider]  losartan (COZAAR) 25 MG tablet Take 1 tablet (25 mg total) by mouth daily. 04/12/21  Yes Meriam Sprague, MD  oxyCODONE-acetaminophen (PERCOCET) 7.5-325 MG tablet Take 1 tablet by mouth every 4 (four) hours as needed for severe pain.   Yes [provider]  rosuvastatin (CRESTOR) 10 MG tablet Take 1 tablet (10 mg total) by mouth daily. 10/10/20 07/05/21 Yes Meriam Sprague, MD  trimethoprim-polymyxin b (POLYTRIM) ophthalmic solution Place 1 drop into the left eye every 4 (four) hours. Every 4 hours while awake for 7 days. 07/05/21  Yes Rhys Martini, PA-C  celecoxib (CELEBREX) 200 MG capsule Take 200 mg by mouth 2 (two) times daily.    [provider]  oxycodone (OXY-IR) 5 MG capsule Take 5 mg by mouth every 4 (four) hours as needed.    [provider]  thiamine (VITAMIN B-1) 100 MG tablet Take 100 mg by mouth daily.    [provider]  omeprazole (PRILOSEC) 20 MG capsule Take 40 mg by mouth daily.  08/29/20  [provider]    Family History Family History  Problem Relation Age of Onset   Healthy Mother  Social History Social History   Tobacco Use   Smoking status: Former    Pack years: 0.00    Types: Cigarettes   Smokeless tobacco: Never  Vaping Use   Vaping Use: Never used  Substance Use Topics   Alcohol use: Yes    Alcohol/week: 6.0 standard drinks    Types: 6 Cans of beer per week    Comment: " i try to stay drunk to make the pain go away"   Drug use: Not Currently     Allergies   Iohexol   Review of Systems Review of Systems  Eyes:  Positive for photophobia, pain, discharge, redness, itching and visual disturbance.  All other systems reviewed and are negative.   Physical Exam Triage Vital Signs ED Triage Vitals  Enc Vitals Group     BP 07/05/21 1130 139/68     Pulse Rate 07/05/21 1130 (S) (!) 47     Resp 07/05/21 1130 18      Temp 07/05/21 1130 98 F (36.7 C)     Temp Source 07/05/21 1130 Oral     SpO2 07/05/21 1130 100 %     Weight --      Height --      Head Circumference --      Peak Flow --      Pain Score 07/05/21 1124 9     Pain Loc --      Pain Edu? --      Excl. in GC? --    No data found.  Updated Vital Signs BP 139/68   Pulse (S) (!) 47   Temp 98 F (36.7 C) (Oral)   Resp 18   SpO2 100%   Visual Acuity Right Eye Distance: 20/40 (20 ft) Left Eye Distance: 20/40 (20 ft) Bilateral Distance:    Right Eye Near:   Left Eye Near:    Bilateral Near:     Physical Exam Vitals reviewed.  Constitutional:      Appearance: Normal appearance.  HENT:     Head: Normocephalic and atraumatic.     Nose: Nose normal. No congestion.  Eyes:     General: Lids are everted, no foreign bodies appreciated. Vision grossly intact. Gaze aligned appropriately. No visual field deficit.       Right eye: No foreign body, discharge or hordeolum.        Left eye: No foreign body, discharge or hordeolum.     Extraocular Movements: Extraocular movements intact.     Right eye: Normal extraocular motion and no nystagmus.     Left eye: Normal extraocular motion and no nystagmus.     Conjunctiva/sclera:     Right eye: Right conjunctiva is not injected. No chemosis, exudate or hemorrhage.    Left eye: Left conjunctiva is injected. Chemosis present. No exudate or hemorrhage.    Pupils: Pupils are equal, round, and reactive to light.     Left eye: Fluorescein uptake present. No corneal abrasion.     Visual Fields: Right eye visual fields normal and left eye visual fields normal.     Comments: L eye- upper eyelid mildly swollen and tender to palpation. Pt did not tolerate exam without topical anesthetic. Following tetracaine- no foreign body visible. L conjunctiva diffusely erythematous. EOMIs intact but with pain, particularly with looking upward and outward. PERRLA. Fluorescein uptake visible- 3x65mm corneal abrasion  below iris.   Cardiovascular:     Rate and Rhythm: Normal rate and regular rhythm.     Heart sounds:  Normal heart sounds.  Pulmonary:     Effort: Pulmonary effort is normal.     Breath sounds: Normal breath sounds.  Neurological:     General: No focal deficit present.     Mental Status: He is alert.  Psychiatric:        Mood and Affect: Mood normal.        Behavior: Behavior normal.        Thought Content: Thought content normal.        Judgment: Judgment normal.     UC Treatments / Results  Labs (all labs ordered are listed, but only abnormal results are displayed) Labs Reviewed - No data to display  EKG   Radiology No results found.  Procedures Procedures (including critical care time)  Medications Ordered in UC Medications - No data to display  Initial Impression / Assessment and Plan / UC Course  I have reviewed the triage vital signs and the nursing notes.  Pertinent labs & imaging results that were available during my care of the patient were reviewed by me and considered in my medical decision making (see chart for details).     This patient is a very pleasant 65 y.o. year old male presenting with corneal abrasion x3 days. Visual acuity intact. Asymptomatic bradycardia, heart rate was also 47 8 months ago.  Fluorescein uptake L eye- corneal abrasion. Chemosis throughout. Pressure L eye 12.  Polytrim drops as below.  F/u with optho tomorrow, information provided.  Exam performed with attending physician Dr. Tracie Harrier, who is in agreement with this treatment plan.  Coding this visit a Level 5 as I spent 60 minutes performing history, exam, fluorescein exam, checking eye pressure, discussing treatment plan with attending physician, and discussing plan for follow-up with patient.   Strict ED return precautions discussed. Patient verbalizes understanding and agreement.   Final Clinical Impressions(s) / UC Diagnoses   Final diagnoses:  Abrasion of left cornea,  initial encounter     Discharge Instructions      -You have a corneal abrasion with overlying infection.  This needs to be treated with antibiotic drops, and close follow-up with an eye doctor. -Apply 1 drop of the Polytrim eyedrops to the left eye every 4 hours while awake for 7 days. -Call ophthalmology today to schedule a follow-up for tomorrow. -If symptoms get worse, like vision changes, flashes of light in field of vision, worsening eye pain, and you are not able to get in with ophthalmology-head straight to the emergency department.     ED Prescriptions     Medication Sig Dispense Auth. Provider   trimethoprim-polymyxin b (POLYTRIM) ophthalmic solution Place 1 drop into the left eye every 4 (four) hours. Every 4 hours while awake for 7 days. 10 mL Rhys Martini, PA-C      PDMP not reviewed this encounter.   Rhys Martini, PA-C 07/05/21 [phone]

## 2021-07-05 NOTE — ED Triage Notes (Signed)
Pt reports scratched left eye about 3 days ago, with fingernail. Now having redness, itching and pain to eye esp with light.

## 2021-07-05 NOTE — Discharge Instructions (Addendum)
-  You have a corneal abrasion with overlying infection.  This needs to be treated with antibiotic drops, and close follow-up with an eye doctor. -Apply 1 drop of the Polytrim eyedrops to the left eye every 4 hours while awake for 7 days. -Call ophthalmology today to schedule a follow-up for tomorrow. -If symptoms get worse, like vision changes, flashes of light in field of vision, worsening eye pain, and you are not able to get in with ophthalmology-head straight to the emergency department.

## 2021-07-06 ENCOUNTER — Encounter (INDEPENDENT_AMBULATORY_CARE_PROVIDER_SITE_OTHER): Payer: Self-pay | Admitting: Ophthalmology

## 2021-07-06 ENCOUNTER — Ambulatory Visit (INDEPENDENT_AMBULATORY_CARE_PROVIDER_SITE_OTHER): Payer: Medicare Other | Admitting: Ophthalmology

## 2021-07-06 DIAGNOSIS — H3581 Retinal edema: Secondary | ICD-10-CM

## 2021-07-06 DIAGNOSIS — H25813 Combined forms of age-related cataract, bilateral: Secondary | ICD-10-CM

## 2021-07-06 DIAGNOSIS — H1830 Unspecified corneal membrane change: Secondary | ICD-10-CM

## 2021-07-06 DIAGNOSIS — B309 Viral conjunctivitis, unspecified: Secondary | ICD-10-CM

## 2021-07-06 NOTE — Progress Notes (Signed)
[facility] - Clinic Note  07/06/2021     CHIEF COMPLAINT Patient presents for Retina Evaluation   HISTORY OF PRESENT ILLNESS: Gerald Bradley is a 65 y.o. male who presents to the clinic today for:   HPI     Retina Evaluation   In left eye.  This started 4 days ago.  Duration of 4 days.  Associated Symptoms Pain, Redness, Photophobia, Glare and Trauma.  Context:  distance vision, mid-range vision and near vision.  Treatments tried include eye drops.  Response to treatment was mild improvement.  I, the attending physician,  performed the HPI with the patient and updated documentation appropriately.        Comments   65 y/o male pt [facility] 7[facility]7.22 for eval of possible eye infection OS.  About 4 days ago, pt was performing maintenance underneath a car when he accidentally hit himself in his left eye.  Eye over the next several days became increasingly red, painful and photophobic.  VA slightly blurred OS as well.  [facility] and put on Polytrim Q every 4 hrs while awake OS.  Symptoms seem mildly improved today.  Pain level still at about a 9 OS.  LEE x 20 yrs.      Last edited by Rennis ChrisZamora, Javonta Gronau, MD on 07/10/2021 12:42 PM.    Pt here on ED f/u for possible eye infection, pt is on polytrim Q4h while awake and states his eye feels better since he started using the ointment  Referring physician: No referring provider defined for this encounter.  HISTORICAL INFORMATION:   Selected notes from the MEDICAL RECORD NUMBER Ref[facility] by [facility] on 7.7.22 for possible eye infection OS   CURRENT MEDICATIONS: Current Outpatient Medications (Ophthalmic Drugs)  Medication Sig   trimethoprim-polymyxin b (POLYTRIM) ophthalmic solution Place 1 drop into the left eye every 4 (four) hours. Every 4 hours while awake for 7 days.   No current facility-administered medications for this visit. (Ophthalmic Drugs)   Current Outpatient Medications (Other)   Medication Sig   acetaminophen (TYLENOL) 500 MG tablet Take by mouth.   allopurinol (ZYLOPRIM) 100 MG tablet Take 100 mg by mouth 2 (two) times daily.   baclofen (LIORESAL) 10 MG tablet baclofen 10 mg tablet  TAKE 1 TABLET BY MOUTH TWICE DAILY AS NEEDED   Buprenorphine HCl-Naloxone HCl 8-2 MG FILM Suboxone 8 mg-2 mg sublingual film  DISSOLVE 1 FILM UNDER TONGUE TWICE DAILY AS NEEDED   celecoxib (CELEBREX) 200 MG capsule Take 200 mg by mouth 2 (two) times daily.   ergocalciferol (VITAMIN D2) 1.25 MG (50000 UT) capsule ergocalciferol (vitamin D2) 1,250 mcg (50,000 unit) capsule  TAKE 1 CAPSULE BY MOUTH WEEKLY   esomeprazole (NEXIUM) 20 MG capsule Take by mouth.   ibuprofen (ADVIL) 600 MG tablet ibuprofen 600 mg tablet  TAKE 1 TABLET BY MOUTH EVERY 6 HOURS   lidocaine (LMX) 4 % cream Apply topically.   losartan (COZAAR) 25 MG tablet Take 1 tablet (25 mg total) by mouth daily.   naloxone (NARCAN) nasal spray 4 mg/0.1 mL Narcan 4 mg/actuation nasal spray  USE 1 SPRAY AS DIRECTED IN EACH NOSTRIL   naproxen (NAPROSYN) 500 MG tablet naproxen 500 mg tablet  TAKE 1 TABLET BY MOUTH TWICE DAILY   oxycodone (OXY-IR) 5 MG capsule Take 5 mg by mouth every 4 (four) hours as needed.   oxyCODONE-acetaminophen (PERCOCET) 7.5-325 MG tablet Take 1 tablet by mouth every  4 (four) hours as needed for severe pain.   polyethylene glycol (GOLYTELY) 236 g solution See directions from GI clinic. Mix with 1 gallon water. Drink half gallon the night before procedure and half gallon on morning of procedure   predniSONE (DELTASONE) 20 MG tablet Take by mouth.   thiamine (VITAMIN B-1) 100 MG tablet Take 100 mg by mouth daily.   allopurinol (ZYLOPRIM) 100 MG tablet allopurinol 100 mg tablet  TAKE 1 TABLET BY MOUTH TWICE DAILY (Patient not taking: Reported on 07/06/2021)   baclofen (LIORESAL) 10 MG tablet Take 10 mg by mouth 3 (three) times daily. (Patient not taking: Reported on 07/06/2021)   rosuvastatin (CRESTOR) 10 MG  tablet Take 1 tablet (10 mg total) by mouth daily.   No current facility-administered medications for this visit. (Other)      REVIEW OF SYSTEMS: ROS   Positive for: Musculoskeletal, Eyes, Respiratory Negative for: Constitutional, Gastrointestinal, Neurological, Skin, Genitourinary, HENT, Endocrine, Cardiovascular, Psychiatric, Allergic/Imm, Heme/Lymph Last edited by Celine Mans, COA on 07/06/2021  1:06 PM.       ALLERGIES Allergies  Allergen Reactions   Iohexol Hives    Patient didn't receive benadryl hives subsided.    PAST MEDICAL HISTORY Past Medical History:  Diagnosis Date   Alcohol dependence (HCC)    Arthritis    Bradycardia    Cannabis abuse    Cardiomyopathy (HCC)    Cataract    Fatty liver    History of pneumothorax    History of rectal bleeding    Homelessness    Hypercholesterolemia    Nicotine dependence    Osteoarthritis    Overweight    Rectal bleeding    Hx of   SOB (shortness of breath)    Toe pain, right    Trapezius muscle spasm    Past Surgical History:  Procedure Laterality Date   LUNG SURGERY     resection for spontaneous pneumothorax skin graft   SKIN SURGERY      FAMILY HISTORY Family History  Problem Relation Age of Onset   Healthy Mother     SOCIAL HISTORY Social History   Tobacco Use   Smoking status: Former    Pack years: 0.00    Types: Cigarettes   Smokeless tobacco: Never  Vaping Use   Vaping Use: Never used  Substance Use Topics   Alcohol use: Yes    Alcohol/week: 6.0 standard drinks    Types: 6 Cans of beer per week    Comment: " i try to stay drunk to make the pain go away"   Drug use: Not Currently         OPHTHALMIC EXAM:  Base Eye Exam     Visual Acuity (Snellen - Linear)       Right Left   Dist [place] 20/20 20/25 -2   Dist ph Mulat  20/20 -2         Tonometry (Tonopen, 1:10 PM)       Right Left   Pressure 14 16         Pupils       Dark Light Shape React APD   Right 4 3 Round Slow  None   Left 4 3 Round Slow None         Visual Fields (Counting fingers)       Left Right    Full Full         Extraocular Movement       Right Left  Full, Ortho Full, Ortho         Neuro/Psych     Oriented x3: Yes   Mood/Affect: Normal         Dilation     Both eyes: 1.0% Mydriacyl, 2.5% Phenylephrine @ 1:10 PM           Slit Lamp and Fundus Exam     Slit Lamp Exam       Right Left   Lids/Lashes Dermatochalasis - upper lid, well healed scar on upper lid Dermatochalasis - upper lid   Conjunctiva/Sclera White and quiet nasal and temporal pinguecula, 2+ Injection   Cornea Arcus, no epi defect or flourescein staining Arcus, no epi defect or flourescein staining   Anterior Chamber Deep and quiet Deep and quiet   Iris Round and dilated Round and dilated   Lens 2+ Nuclear sclerosis, 2+ Cortical cataract 2+ Nuclear sclerosis, 2+ Cortical cataract   Vitreous Vitreous syneresis Vitreous syneresis         Fundus Exam       Right Left   Disc Pink and Sharp, +PPP Pink and Sharp, +PPP   C/D Ratio 0.5 0.5   Macula Flat, Good foveal reflex Flat, Good foveal reflex   Vessels attenuated attenuated   Periphery Attached    Attached              Refraction     Manifest Refraction       Sphere Cylinder Dist VA   Right -0.25 Sphere 20/20   Left -0.50 Sphere 20/20-            IMAGING AND PROCEDURES  Imaging and Procedures for 07/06/2021  OCT, Retina - OU - Both Eyes       Right Eye Quality was good. Central Foveal Thickness: 259. Progression has no prior data. Findings include normal foveal contour, no IRF, no SRF.   Left Eye Quality was good. Central Foveal Thickness: 257. Progression has no prior data. Findings include normal foveal contour, no IRF, no SRF, vitreomacular adhesion .   Notes *Images captured and stored on drive  Diagnosis / Impression:  NFP; no IRF/SRF OU  Clinical management:  See below  Abbreviations: NFP - Normal  foveal profile. CME - cystoid macular edema. PED - pigment epithelial detachment. IRF - intraretinal fluid. SRF - subretinal fluid. EZ - ellipsoid zone. ERM - epiretinal membrane. ORA - outer retinal atrophy. ORT - outer retinal tubulation. SRHM - subretinal hyper-reflective material. IRHM - intraretinal hyper-reflective material              ASSESSMENT/PLAN:    ICD-10-CM   1. 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 Acute viral conjunctivitis of left eye  B30.9     3. Retinal edema  H35.81 OCT, Retina - OU - Both Eyes    4. Combined forms of age-related cataract of both eyes  H25.813       1. Corneal epi defect OS -- closed today - symptoms improving, but still with some irritation / mild conjunctivitis - continue Polytrim Q4H as prescribed by ED - f/u here prn  3. No retinal edema on exam or OCT  4. Mixed Cataract OU - The symptoms of cataract, surgical options, and treatments and risks were discussed with patient. - discussed diagnosis and progression - not yet visually significant - monitor for now   Ophthalmic Meds Ordered this visit:  No orders of the defined types were placed in this encounter.  Return if symptoms worsen or fail to improve.  There are no Patient Instructions on file for this visit.   Explained the diagnoses, plan, and follow up with the patient and they expressed understanding.  Patient expressed understanding of the importance of proper follow up care.   This document serves as a record of services personally performed by Karie Chimera, MD, PhD. It was created on their behalf by Cristopher Estimable, COT an ophthalmic technician. The creation of this record is the provider's dictation and/or activities during the visit.    Electronically signed by: Cristopher Estimable, COT 7.8.22 @ 12:45 PM   Karie Chimera, M.D., Ph.D. Diseases & Surgery of the Retina and Vitreous [facility] 07.08.22  I have reviewed the above  documentation for accuracy and completeness, and I agree with the above. Karie Chimera, M.D., Ph.D. 07/10/21 12:45 PM   Abbreviations: M myopia (nearsighted); A astigmatism; H hyperopia (farsighted); P presbyopia; Mrx spectacle prescription;  CTL contact lenses; OD right eye; OS left eye; OU both eyes  XT exotropia; ET esotropia; PEK punctate epithelial keratitis; PEE punctate epithelial erosions; DES dry eye syndrome; MGD meibomian gland dysfunction; ATs artificial tears; PFAT's preservative free artificial tears; NSC nuclear sclerotic cataract; PSC posterior subcapsular cataract; ERM epi-retinal membrane; PVD posterior vitreous detachment; RD retinal detachment; DM diabetes mellitus; DR diabetic retinopathy; NPDR non-proliferative diabetic retinopathy; PDR proliferative diabetic retinopathy; CSME clinically significant macular edema; DME diabetic macular edema; dbh dot blot hemorrhages; CWS cotton wool spot; POAG primary open angle glaucoma; C/D cup-to-disc ratio; HVF humphrey visual field; GVF goldmann visual field; OCT optical coherence tomography; IOP intraocular pressure; BRVO Branch retinal vein occlusion; CRVO central retinal vein occlusion; CRAO central retinal artery occlusion; BRAO branch retinal artery occlusion; RT retinal tear; SB scleral buckle; PPV pars plana vitrectomy; VH Vitreous hemorrhage; PRP panretinal laser photocoagulation; IVK intravitreal kenalog; VMT vitreomacular traction; MH Macular hole;  NVD neovascularization of the disc; NVE neovascularization elsewhere; AREDS age related eye disease study; ARMD age related macular degeneration; POAG primary open angle glaucoma; EBMD epithelial/anterior basement membrane dystrophy; ACIOL anterior chamber intraocular lens; IOL intraocular lens; PCIOL posterior chamber intraocular lens; Phaco/IOL phacoemulsification with intraocular lens placement; PRK photorefractive keratectomy; LASIK laser assisted in situ keratomileusis; HTN hypertension; DM  diabetes mellitus; COPD chronic obstructive pulmonary disease

## 2021-07-10 ENCOUNTER — Encounter (INDEPENDENT_AMBULATORY_CARE_PROVIDER_SITE_OTHER): Payer: Self-pay | Admitting: Ophthalmology

## 2021-07-18 ENCOUNTER — Ambulatory Visit (INDEPENDENT_AMBULATORY_CARE_PROVIDER_SITE_OTHER): Payer: Medicare Other | Admitting: Ophthalmology

## 2021-07-18 ENCOUNTER — Encounter (INDEPENDENT_AMBULATORY_CARE_PROVIDER_SITE_OTHER): Payer: Self-pay | Admitting: Ophthalmology

## 2021-07-18 ENCOUNTER — Other Ambulatory Visit: Payer: Self-pay

## 2021-07-18 DIAGNOSIS — H04123 Dry eye syndrome of bilateral lacrimal glands: Secondary | ICD-10-CM

## 2021-07-18 DIAGNOSIS — H25813 Combined forms of age-related cataract, bilateral: Secondary | ICD-10-CM

## 2021-07-18 DIAGNOSIS — H1132 Conjunctival hemorrhage, left eye: Secondary | ICD-10-CM

## 2021-07-18 DIAGNOSIS — H3581 Retinal edema: Secondary | ICD-10-CM

## 2021-07-18 NOTE — Progress Notes (Signed)
[facility] Note  07/18/2021     CHIEF COMPLAINT Patient presents for Retina Follow Up   HISTORY OF PRESENT ILLNESS: Gerald Bradley is a 65 y.o. male who presents to the clinic today for:   HPI     Retina Follow Up   Patient presents with  Other.  In left eye.  This started 1 day ago.  Duration of 1 day.  I, the attending physician,  performed the HPI with the patient and updated documentation appropriately.        Comments   Pt here for problem appointment, ret eval for subconj. Heme OS. Pt states yesterday the redness showed up, vision has been stable but pt reports pain in OS. Pain 8/10. Experiencing photophobia. He has not seen any improvement since taking the antibiotic gtts given to him from the ER before previous appointment with [place]. He does report continuing use of antibiotic drops up until yesterday. Using lubricant gtts also.       Last edited by Bernarda Caffey, MD on 07/18/2021 11:08 PM.    Pt states his eye started turning red over night with no obvious cause, no eye trauma, he denies being on aspirin or blood thinners, he states he also has eye pain that is better not than it was at first, he is using lubricating drops, he was using the antibiotic drops fromt he ED until yesterday  Referring physician: No referring provider defined for this encounter.  HISTORICAL INFORMATION:   Selected notes from the MEDICAL RECORD NUMBER Referred by [facility] on 7.7.22 for possible eye infection OS   CURRENT MEDICATIONS: Current Outpatient Medications (Ophthalmic Drugs)  Medication Sig   trimethoprim-polymyxin b (POLYTRIM) ophthalmic solution Place 1 drop into the left eye every 4 (four) hours. Every 4 hours while awake for 7 days.   No current facility-administered medications for this visit. (Ophthalmic Drugs)   Current Outpatient Medications (Other)  Medication Sig   acetaminophen (TYLENOL) 500 MG tablet Take by mouth.   allopurinol (ZYLOPRIM)  100 MG tablet Take 100 mg by mouth 2 (two) times daily.   allopurinol (ZYLOPRIM) 100 MG tablet allopurinol 100 mg tablet  TAKE 1 TABLET BY MOUTH TWICE DAILY (Patient not taking: Reported on 07/06/2021)   baclofen (LIORESAL) 10 MG tablet Take 10 mg by mouth 3 (three) times daily. (Patient not taking: Reported on 07/06/2021)   baclofen (LIORESAL) 10 MG tablet baclofen 10 mg tablet  TAKE 1 TABLET BY MOUTH TWICE DAILY AS NEEDED   Buprenorphine HCl-Naloxone HCl 8-2 MG FILM Suboxone 8 mg-2 mg sublingual film  DISSOLVE 1 FILM UNDER TONGUE TWICE DAILY AS NEEDED   celecoxib (CELEBREX) 200 MG capsule Take 200 mg by mouth 2 (two) times daily.   ergocalciferol (VITAMIN D2) 1.25 MG (50000 UT) capsule ergocalciferol (vitamin D2) 1,250 mcg (50,000 unit) capsule  TAKE 1 CAPSULE BY MOUTH WEEKLY   esomeprazole (NEXIUM) 20 MG capsule Take by mouth.   ibuprofen (ADVIL) 600 MG tablet ibuprofen 600 mg tablet  TAKE 1 TABLET BY MOUTH EVERY 6 HOURS   lidocaine (LMX) 4 % cream Apply topically.   losartan (COZAAR) 25 MG tablet Take 1 tablet (25 mg total) by mouth daily.   naloxone (NARCAN) nasal spray 4 mg/0.1 mL Narcan 4 mg/actuation nasal spray  USE 1 SPRAY AS DIRECTED IN EACH NOSTRIL   naproxen (NAPROSYN) 500 MG tablet naproxen 500 mg tablet  TAKE 1 TABLET BY MOUTH TWICE DAILY   oxycodone (OXY-IR) 5 MG capsule  Take 5 mg by mouth every 4 (four) hours as needed.   oxyCODONE-acetaminophen (PERCOCET) 7.5-325 MG tablet Take 1 tablet by mouth every 4 (four) hours as needed for severe pain.   polyethylene glycol (GOLYTELY) 236 g solution See directions from GI clinic. Mix with 1 gallon water. Drink half gallon the night before procedure and half gallon on morning of procedure   predniSONE (DELTASONE) 20 MG tablet Take by mouth.   rosuvastatin (CRESTOR) 10 MG tablet Take 1 tablet (10 mg total) by mouth daily.   thiamine (VITAMIN B-1) 100 MG tablet Take 100 mg by mouth daily.   No current facility-administered medications for  this visit. (Other)      REVIEW OF SYSTEMS: ROS   Positive for: Musculoskeletal, Eyes, Respiratory Negative for: Constitutional, Gastrointestinal, Neurological, Skin, Genitourinary, HENT, Endocrine, Cardiovascular, Psychiatric, Allergic/Imm, Heme/Lymph Last edited by Kingsley Spittle, COT on 07/18/2021 12:53 PM.       ALLERGIES Allergies  Allergen Reactions   Iohexol Hives    Patient didn't receive benadryl hives subsided.    PAST MEDICAL HISTORY Past Medical History:  Diagnosis Date   Alcohol dependence ([place])    Arthritis    Bradycardia    Cannabis abuse    Cardiomyopathy ([place])    Cataract    Fatty liver    History of pneumothorax    History of rectal bleeding    Homelessness    Hypercholesterolemia    Nicotine dependence    Osteoarthritis    Overweight    Rectal bleeding    Hx of   SOB (shortness of breath)    Toe pain, right    Trapezius muscle spasm    Past Surgical History:  Procedure Laterality Date   LUNG SURGERY     resection for spontaneous pneumothorax skin graft   SKIN SURGERY      FAMILY HISTORY Family History  Problem Relation Age of Onset   Healthy Mother     SOCIAL HISTORY Social History   Tobacco Use   Smoking status: Former    Types: Cigarettes   Smokeless tobacco: Never  Vaping Use   Vaping Use: Never used  Substance Use Topics   Alcohol use: Yes    Alcohol/week: 6.0 standard drinks    Types: 6 Cans of beer per week    Comment: " i try to stay drunk to make the pain go away"   Drug use: Not Currently         OPHTHALMIC EXAM:  Base Eye Exam     Visual Acuity (Snellen - Linear)       Right Left   Dist [place] 20/25 +2 20/40 +2   Dist ph [place]  20/20         Tonometry (Tonopen, 1:00 PM)       Right Left   Pressure 15 10         Pupils       Dark Light Shape React APD   Right 4 3 Round Minimal None   Left 4 4 Round Minimal None         Visual Fields (Counting fingers)       Left Right    Full Full          Extraocular Movement       Right Left    Full, Ortho Full, Ortho         Neuro/Psych     Oriented x3: Yes   Mood/Affect: Normal  Dilation     Both eyes: 1.0% Mydriacyl, 2.5% Phenylephrine @ 1:01 PM           Slit Lamp and Fundus Exam     Slit Lamp Exam       Right Left   Lids/Lashes Dermatochalasis - upper lid, well healed scar on upper lid Dermatochalasis - upper lid   Conjunctiva/Sclera White and quiet Nasal and inferior subconj heme; nasal and temporal pinguecula   Cornea Arcus, no epi defect or flourescein staining Arcus, 1+ Punctate epithelial erosions   Anterior Chamber Deep and quiet Deep and quiet   Iris Round and dilated Round and dilated   Lens 2+ Nuclear sclerosis, 2+ Cortical cataract 2+ Nuclear sclerosis, 2+ Cortical cataract   Vitreous Vitreous syneresis Vitreous syneresis         Fundus Exam       Right Left   Disc Pink and Sharp, +PPP Pink and Sharp, +PPP   C/D Ratio 0.5 0.5   Macula Flat, Good foveal reflex Flat, Good foveal reflex   Vessels attenuated attenuated   Periphery Attached    Attached               IMAGING AND PROCEDURES  Imaging and Procedures for 07/18/2021  OCT, Retina - OU - Both Eyes       Right Eye Quality was good. Central Foveal Thickness: 262. Progression has been stable. Findings include normal foveal contour, no IRF, no SRF.   Left Eye Quality was good. Central Foveal Thickness: 261. Progression has been stable. Findings include normal foveal contour, no IRF, no SRF, vitreomacular adhesion .   Notes *Images captured and stored on drive  Diagnosis / Impression:  NFP; no IRF/SRF OU  Clinical management:  See below  Abbreviations: NFP - Normal foveal profile. CME - cystoid macular edema. PED - pigment epithelial detachment. IRF - intraretinal fluid. SRF - subretinal fluid. EZ - ellipsoid zone. ERM - epiretinal membrane. ORA - outer retinal atrophy. ORT - outer retinal tubulation. SRHM -  subretinal hyper-reflective material. IRHM - intraretinal hyper-reflective material               ASSESSMENT/PLAN:    ICD-10-CM   1. Subconjunctival hemorrhage of left eye  H11.32     2. Retinal edema  H35.81 OCT, Retina - OU - Both Eyes    3. Dry eyes  H04.123     4. Combined forms of age-related cataract of both eyes  H25.813        1. Subconj heme OS  - onset 7.19.22  - nasal and inferior subconj heme  - discussed findings and prognosis  - no treatment -- will clear on its own in 1-2 wks  - f/u prn  2. No retinal edema on exam or OCT  3. Dry eyes OU - recommend artificial tears and lubricating ointment as needed  4. Mixed Cataract OU - The symptoms of cataract, surgical options, and treatments and risks were discussed with patient. - discussed diagnosis and progression - not yet visually significant - monitor for now   Ophthalmic Meds Ordered this visit:  No orders of the defined types were placed in this encounter.      No follow-ups on file.  There are no Patient Instructions on file for this visit.   Explained the diagnoses, plan, and follow up with the patient and they expressed understanding.  Patient expressed understanding of the importance of proper follow up care.   This document serves as a record  of services personally performed by Gardiner Sleeper, MD, PhD. It was created on their behalf by San Jetty. Owens Shark, OA an ophthalmic technician. The creation of this record is the provider's dictation and/or activities during the visit.    Electronically signed by: San Jetty. Owens Shark, [place] 07.20.2022 11:26 PM   Gardiner Sleeper, M.D., Ph.D. Diseases & Surgery of the Retina and Vitreous [facility]  I have reviewed the above documentation for accuracy and completeness, and I agree with the above. Gardiner Sleeper, M.D., Ph.D. 07/18/21 11:26 PM   Abbreviations: M myopia (nearsighted); A astigmatism; H hyperopia (farsighted); P  presbyopia; Mrx spectacle prescription;  CTL contact lenses; OD right eye; OS left eye; OU both eyes  XT exotropia; ET esotropia; PEK punctate epithelial keratitis; PEE punctate epithelial erosions; DES dry eye syndrome; MGD meibomian gland dysfunction; ATs artificial tears; PFAT's preservative free artificial tears; [place] nuclear sclerotic cataract; PSC posterior subcapsular cataract; ERM epi-retinal membrane; PVD posterior vitreous detachment; RD retinal detachment; DM diabetes mellitus; DR diabetic retinopathy; NPDR non-proliferative diabetic retinopathy; PDR proliferative diabetic retinopathy; CSME clinically significant macular edema; DME diabetic macular edema; dbh dot blot hemorrhages; CWS cotton wool spot; POAG primary open angle glaucoma; C/D cup-to-disc ratio; HVF humphrey visual field; GVF goldmann visual field; OCT optical coherence tomography; IOP intraocular pressure; BRVO Branch retinal vein occlusion; CRVO central retinal vein occlusion; CRAO central retinal artery occlusion; BRAO branch retinal artery occlusion; RT retinal tear; SB scleral buckle; PPV pars plana vitrectomy; VH Vitreous hemorrhage; PRP panretinal laser photocoagulation; IVK intravitreal kenalog; VMT vitreomacular traction; MH Macular hole;  NVD neovascularization of the disc; NVE neovascularization elsewhere; AREDS age related eye disease study; ARMD age related macular degeneration; POAG primary open angle glaucoma; EBMD epithelial/anterior basement membrane dystrophy; ACIOL anterior chamber intraocular lens; IOL intraocular lens; PCIOL posterior chamber intraocular lens; Phaco/IOL phacoemulsification with intraocular lens placement; [place] photorefractive keratectomy; LASIK laser assisted in situ keratomileusis; HTN hypertension; DM diabetes mellitus; COPD chronic obstructive pulmonary disease

## 2021-07-22 ENCOUNTER — Other Ambulatory Visit: Payer: Self-pay

## 2021-07-22 ENCOUNTER — Encounter (HOSPITAL_COMMUNITY): Payer: Self-pay

## 2021-07-22 ENCOUNTER — Ambulatory Visit (HOSPITAL_COMMUNITY)
Admission: EM | Admit: 2021-07-22 | Discharge: 2021-07-22 | Disposition: A | Discharge status: Home / Self Care | Payer: Medicare Other | Attending: Emergency Medicine | Admitting: Emergency Medicine

## 2021-07-22 ENCOUNTER — Emergency Department (HOSPITAL_COMMUNITY)
Admission: EM | Admit: 2021-07-22 | Discharge: 2021-07-22 | Disposition: A | Discharge status: Home / Self Care | Payer: Medicare Other | Attending: Emergency Medicine | Admitting: Emergency Medicine

## 2021-07-22 DIAGNOSIS — H1132 Conjunctival hemorrhage, left eye: Secondary | ICD-10-CM

## 2021-07-22 DIAGNOSIS — Z87891 Personal history of nicotine dependence: Secondary | ICD-10-CM | POA: Diagnosis not present

## 2021-07-22 DIAGNOSIS — H5712 Ocular pain, left eye: Secondary | ICD-10-CM | POA: Diagnosis present

## 2021-07-22 DIAGNOSIS — R519 Headache, unspecified: Secondary | ICD-10-CM

## 2021-07-22 MED ORDER — TETRACAINE HCL 0.5 % OP SOLN: 2.0000 [drp] | Freq: Once | OPHTHALMIC | Status: DC

## 2021-07-22 MED ORDER — FLUORESCEIN SODIUM 1 MG OP STRP: 1.0000 | ORAL_STRIP | Freq: Once | OPHTHALMIC | Status: DC

## 2021-07-22 MED ORDER — TETRACAINE HCL 0.5 % OP SOLN
OPHTHALMIC | Status: AC
  Filled 2021-07-22: qty 4

## 2021-07-22 MED ORDER — FLUORESCEIN SODIUM 1 MG OP STRP
ORAL_STRIP | OPHTHALMIC | Status: AC
  Filled 2021-07-22: qty 1

## 2021-07-22 NOTE — ED Provider Notes (Signed)
[facility]    CSN: [id number] Arrival date & time: 07/22/21  1216      History   Chief Complaint Chief Complaint  Patient presents with   Eye Pain    HPI Gerald Bradley is a 65 y.o. male.   HPI Gerald Bradley is a 65 y.o. male presenting to UC with c/o worsening Left eye pain, swelling and redness since being seen in UC initially on 07/05/21 for a corneal abrasion.  He completed antibiotic drops and was seen twice by Dr. Vanessa Barbara, Retina specialist.  Last visit was on 07/18/21.  Per pt, "everything was normal" pt was advised to continue artifical tears, which he has done.  Yesterday, pain worsened above Left eye, worse with movement of eye to the Right.  No change in vision. Associated mild Left side frontal headache. No dizziness, nausea or vomiting. No congestion, fever, or chills. No ear pain or hearing loss.    Past Medical History:  Diagnosis Date   Alcohol dependence ([facility])    Arthritis    Bradycardia    Cannabis abuse    Cardiomyopathy ([facility])    Cataract    Fatty liver    History of pneumothorax    History of rectal bleeding    Homelessness    Hypercholesterolemia    Nicotine dependence    Osteoarthritis    Overweight    Rectal bleeding    Hx of   SOB (shortness of breath)    Toe pain, right    Trapezius muscle spasm     Patient Active Problem List   Diagnosis Date Noted   Heavy alcohol use 08/29/2020   Gout 08/29/2020   Cardiomyopathy 04/19/2012   HYPERCHOLESTEROLEMIA 08/23/2009   BRADYCARDIA 07/06/2009   ARTHRITIS 07/06/2009   SHORTNESS OF BREATH 07/06/2009   RECTAL BLEEDING, HX OF 07/06/2009    Past Surgical History:  Procedure Laterality Date   LUNG SURGERY     resection for spontaneous pneumothorax skin graft   SKIN SURGERY         Home Medications    Prior to Admission medications   Medication Sig Start Date End Date Taking? Authorizing Provider  acetaminophen (TYLENOL) 500 MG tablet Take by mouth. 02/09/18   [provider]  allopurinol (ZYLOPRIM) 100 MG tablet Take 100 mg by mouth 2 (two) times daily.    [provider]  allopurinol (ZYLOPRIM) 100 MG tablet allopurinol 100 mg tablet  TAKE 1 TABLET BY MOUTH TWICE DAILY Patient not taking: Reported on 07/06/2021    [provider]  baclofen (LIORESAL) 10 MG tablet Take 10 mg by mouth 3 (three) times daily. Patient not taking: Reported on 07/06/2021    [provider]  baclofen (LIORESAL) 10 MG tablet baclofen 10 mg tablet  TAKE 1 TABLET BY MOUTH TWICE DAILY AS NEEDED    [provider]  Buprenorphine HCl-Naloxone HCl 8-2 MG FILM Suboxone 8 mg-2 mg sublingual film  DISSOLVE 1 FILM UNDER TONGUE TWICE DAILY AS NEEDED    [provider]  celecoxib (CELEBREX) 200 MG capsule Take 200 mg by mouth 2 (two) times daily.    [provider]  ergocalciferol (VITAMIN D2) 1.25 MG (50000 UT) capsule ergocalciferol (vitamin D2) 1,250 mcg (50,000 unit) capsule  TAKE 1 CAPSULE BY MOUTH WEEKLY    [provider]  esomeprazole (NEXIUM) 20 MG capsule Take by mouth. 12/17/17   [provider]  ibuprofen (ADVIL) 600 MG tablet ibuprofen 600 mg tablet  TAKE 1 TABLET BY  MOUTH EVERY 6 HOURS    [provider]  lidocaine (LMX) 4 % cream Apply topically. 10/17/16   [provider]  losartan (COZAAR) 25 MG tablet Take 1 tablet (25 mg total) by mouth daily. 04/12/21   Meriam Sprague, MD  naloxone [facility]) nasal spray 4 mg/0.1 mL Narcan 4 mg/actuation nasal spray  USE 1 SPRAY AS DIRECTED IN EACH NOSTRIL    [provider]  naproxen (NAPROSYN) 500 MG tablet naproxen 500 mg tablet  TAKE 1 TABLET BY MOUTH TWICE DAILY    [provider]  oxycodone (OXY-IR) 5 MG capsule Take 5 mg by mouth every 4 (four) hours as needed.    [provider]  oxyCODONE-acetaminophen (PERCOCET) 7.5-325 MG tablet Take 1 tablet by mouth every 4 (four) hours as needed for severe pain.    [provider]  polyethylene glycol (GOLYTELY) 236 g solution See directions from GI clinic. Mix with 1 gallon water. Drink half gallon the night before procedure and half gallon on morning of procedure 03/20/17   [provider]  predniSONE (DELTASONE) 20 MG tablet Take by mouth. 02/09/18   [provider]  rosuvastatin (CRESTOR) 10 MG tablet Take 1 tablet (10 mg total) by mouth daily. 10/10/20 07/05/21  Meriam Sprague, MD  thiamine (VITAMIN B-1) 100 MG tablet Take 100 mg by mouth daily.    [provider]  trimethoprim-polymyxin b (POLYTRIM) ophthalmic solution Place 1 drop into the left eye every 4 (four) hours. Every 4 hours while awake for 7 days. 07/05/21   Rhys Martini, PA-C  omeprazole (PRILOSEC) 20 MG capsule Take 40 mg by mouth daily.  08/29/20  [provider]    Family History Family History  Problem Relation Age of Onset   Healthy Mother     Social History Social History   Tobacco Use   Smoking status: Former    Types: Cigarettes   Smokeless tobacco: Never  Vaping Use   Vaping Use: Never used  Substance Use Topics   Alcohol use: Yes    Alcohol/week: 6.0 standard drinks    Types: 6 Cans of beer per week    Comment: " i try to stay drunk to make the pain go away"   Drug use: Not Currently     Allergies   Iohexol   Review of Systems Review of Systems  Eyes:  Positive for photophobia, pain, discharge and redness. Negative for visual disturbance.  Musculoskeletal:  Negative for neck pain and neck stiffness.  All other systems reviewed and are negative.   Physical Exam Triage Vital Signs ED Triage Vitals [07/22/21 1416]  Enc Vitals Group     BP (!) 153/84     Pulse Rate (!) 50     Resp 18     Temp 98.5 F (36.9 C)     Temp Source Oral     SpO2 97 %     Weight      Height      Head Circumference      Peak Flow      Pain Score 9     Pain Loc      Pain Edu?      Excl. in GC?    No data found.  Updated Vital  Signs BP (!) 153/84 (BP Location: Left Arm)   Pulse (!) 50   Temp 98.5 F (36.9 C) (Oral)   Resp 18   SpO2 97%   Visual Acuity Right Eye Distance:  20/25 Left Eye Distance: 20/25 Bilateral Distance: 20/20  Right Eye Near:   Left Eye Near:    Bilateral Near:     Physical Exam Constitutional:      General: He is not in acute distress.    Appearance: Normal appearance. He is not ill-appearing, toxic-appearing or diaphoretic.  HENT:     Head: Normocephalic.     Right Ear: Tympanic membrane and ear canal normal.     Left Ear: Tympanic membrane and ear canal normal.     Nose: Nose normal.     Mouth/Throat:     Mouth: Mucous membranes are moist.     Pharynx: Oropharynx is clear.  Eyes:     General: Vision grossly intact. Gaze aligned appropriately.        Left eye: No foreign body, discharge or hordeolum.     Extraocular Movements: Extraocular movements intact.     Conjunctiva/sclera:     Left eye: Chemosis and hemorrhage present.     Pupils: Pupils are equal, round, and reactive to light.      Comments: EOMs intact, however, pain worse in Left eye with movement to the Right.  No fluorescein uptake on exam today.   Cardiovascular:     Rate and Rhythm: Normal rate.  Pulmonary:     Effort: Pulmonary effort is normal. No respiratory distress.  Musculoskeletal:     Cervical back: Normal range of motion and neck supple.  Skin:    General: Skin is warm and dry.  Neurological:     General: No focal deficit present.     Mental Status: He is alert and oriented to person, place, and time.  Psychiatric:        Mood and Affect: Mood normal.        Behavior: Behavior normal.     UC Treatments / Results  Labs (all labs ordered are listed, but only abnormal results are displayed) Labs Reviewed - No data to display  EKG   Radiology No results found.  Procedures Procedures (including critical care time)  Medications Ordered in UC Medications - No data to  display  Initial Impression / Assessment and Plan / UC Course  I have reviewed the triage vital signs and the nursing notes.  Pertinent labs & imaging results that were available during my care of the patient were reviewed by me and considered in my medical decision making (see chart for details).     Due to worsening eye pain since last appointment with retina specialist on 07/18/21 (no evidence of corneal abrasion or ulceration noted today), and associated Left side frontal headache, recommend further evaluation in emergency department.  Pt understanding and agreeable with tx plan.   Final Clinical Impressions(s) / UC Diagnoses   Final diagnoses:  Acute left eye pain  Subconjunctival hemorrhage of left eye  Left-sided headache     Discharge Instructions       Due to the worsening of your eye pain and associated Left side head pain, it is recommended you go to the emergency department today for further evaluation and treatment of your worsening symptoms.      ED Prescriptions   None    PDMP not reviewed this encounter.   Lurene Shadow, [place] 07/22/21 1534

## 2021-07-22 NOTE — Discharge Instructions (Addendum)
  Due to the worsening of your eye pain and associated Left side head pain, it is recommended you go to the emergency department today for further evaluation and treatment of your worsening symptoms.

## 2021-07-22 NOTE — ED Provider Notes (Signed)
[facility] EMERGENCY DEPARTMENT Provider Note   CSN: [id number] Arrival date & time: 07/22/21  1529     History No chief complaint on file.   Gerald Bradley is a 65 y.o. male.  65 year old male presents with complaint of ongoing redness of the left eye.  Initially seen at 4 corneal abrasion, given antibiotics and followed up with ophthalmology 4 days ago where he was diagnosed with a subconjunctival hemorrhage onset 5 days ago.  Patient was advised to continue with lubricating drops, informed of self-limiting condition and follow-up as needed.  Patient returns today because his eye is still red. No new complaints or concerns.       Past Medical History:  Diagnosis Date   Alcohol dependence (HCC)    Arthritis    Bradycardia    Cannabis abuse    Cardiomyopathy (HCC)    Cataract    Fatty liver    History of pneumothorax    History of rectal bleeding    Homelessness    Hypercholesterolemia    Nicotine dependence    Osteoarthritis    Overweight    Rectal bleeding    Hx of   SOB (shortness of breath)    Toe pain, right    Trapezius muscle spasm     Patient Active Problem List   Diagnosis Date Noted   Heavy alcohol use 08/29/2020   Gout 08/29/2020   Cardiomyopathy 04/19/2012   HYPERCHOLESTEROLEMIA 08/23/2009   BRADYCARDIA 07/06/2009   ARTHRITIS 07/06/2009   SHORTNESS OF BREATH 07/06/2009   RECTAL BLEEDING, HX OF 07/06/2009    Past Surgical History:  Procedure Laterality Date   LUNG SURGERY     resection for spontaneous pneumothorax skin graft   SKIN SURGERY         Family History  Problem Relation Age of Onset   Healthy Mother     Social History   Tobacco Use   Smoking status: Former    Types: Cigarettes   Smokeless tobacco: Never  Vaping Use   Vaping Use: Never used  Substance Use Topics   Alcohol use: Yes    Alcohol/week: 6.0 standard drinks    Types: 6 Cans of beer per week    Comment: " i try to stay drunk to make the pain  go away"   Drug use: Not Currently    Home Medications Prior to Admission medications   Medication Sig Start Date End Date Taking? Authorizing Provider  acetaminophen (TYLENOL) 500 MG tablet Take by mouth. 02/09/18   [provider]  allopurinol (ZYLOPRIM) 100 MG tablet Take 100 mg by mouth 2 (two) times daily.    [provider]  allopurinol (ZYLOPRIM) 100 MG tablet allopurinol 100 mg tablet  TAKE 1 TABLET BY MOUTH TWICE DAILY Patient not taking: Reported on 07/06/2021    [provider]  baclofen (LIORESAL) 10 MG tablet Take 10 mg by mouth 3 (three) times daily. Patient not taking: Reported on 07/06/2021    [provider]  baclofen (LIORESAL) 10 MG tablet baclofen 10 mg tablet  TAKE 1 TABLET BY MOUTH TWICE DAILY AS NEEDED    [provider]  Buprenorphine HCl-Naloxone HCl 8-2 MG FILM Suboxone 8 mg-2 mg sublingual film  DISSOLVE 1 FILM UNDER TONGUE TWICE DAILY AS NEEDED    [provider]  celecoxib (CELEBREX) 200 MG capsule Take 200 mg by mouth 2 (two) times daily.    [provider]  ergocalciferol (VITAMIN D2) 1.25 MG (50000 UT) capsule ergocalciferol (  vitamin D2) 1,250 mcg (50,000 unit) capsule  TAKE 1 CAPSULE BY MOUTH WEEKLY    [provider]  esomeprazole (NEXIUM) 20 MG capsule Take by mouth. 12/17/17   [provider]  ibuprofen (ADVIL) 600 MG tablet ibuprofen 600 mg tablet  TAKE 1 TABLET BY MOUTH EVERY 6 HOURS    [provider]  lidocaine (LMX) 4 % cream Apply topically. 10/17/16   [provider]  losartan (COZAAR) 25 MG tablet Take 1 tablet (25 mg total) by mouth daily. 04/12/21   Meriam Sprague, MD  naloxone [facility]) nasal spray 4 mg/0.1 mL Narcan 4 mg/actuation nasal spray  USE 1 SPRAY AS DIRECTED IN EACH NOSTRIL    [provider]  naproxen (NAPROSYN) 500 MG tablet naproxen 500 mg tablet  TAKE 1 TABLET BY MOUTH TWICE DAILY    [provider]  oxycodone  (OXY-IR) 5 MG capsule Take 5 mg by mouth every 4 (four) hours as needed.    [provider]  oxyCODONE-acetaminophen (PERCOCET) 7.5-325 MG tablet Take 1 tablet by mouth every 4 (four) hours as needed for severe pain.    [provider]  polyethylene glycol (GOLYTELY) 236 g solution See directions from GI clinic. Mix with 1 gallon water. Drink half gallon the night before procedure and half gallon on morning of procedure 03/20/17   [provider]  predniSONE (DELTASONE) 20 MG tablet Take by mouth. 02/09/18   [provider]  rosuvastatin (CRESTOR) 10 MG tablet Take 1 tablet (10 mg total) by mouth daily. 10/10/20 07/05/21  Meriam Sprague, MD  thiamine (VITAMIN B-1) 100 MG tablet Take 100 mg by mouth daily.    [provider]  trimethoprim-polymyxin b (POLYTRIM) ophthalmic solution Place 1 drop into the left eye every 4 (four) hours. Every 4 hours while awake for 7 days. 07/05/21   Rhys Martini, PA-C  omeprazole (PRILOSEC) 20 MG capsule Take 40 mg by mouth daily.  08/29/20  [provider]    Allergies    Iohexol  Review of Systems   Review of Systems  Constitutional:  Negative for fever.  Eyes:  Positive for redness. Negative for photophobia, discharge, itching and visual disturbance.  Musculoskeletal:  Negative for gait problem.  Skin:  Negative for wound.  Allergic/Immunologic: Negative for immunocompromised state.  Neurological:  Negative for dizziness, weakness and headaches.  Hematological:  Does not bruise/bleed easily.  All other systems reviewed and are negative.  Physical Exam Updated Vital Signs BP (!) 180/74 (BP Location: Right Arm)   Pulse (!) 52   Temp 98.6 F (37 C) (Oral)   Resp 19   SpO2 98%   Physical Exam Vitals and nursing note reviewed.  Constitutional:      General: He is not in acute distress.    Appearance: He is well-developed. He is not diaphoretic.  HENT:     Head: Normocephalic and atraumatic.   Eyes:     Extraocular Movements: Extraocular movements intact.     Conjunctiva/sclera:     Left eye: Hemorrhage present.     Pupils: Pupils are equal, round, and reactive to light.  Pulmonary:     Effort: Pulmonary effort is normal.  Musculoskeletal:     Cervical back: Normal range of motion.  Skin:    General: Skin is warm and dry.     Findings: No erythema or rash.  Neurological:     Mental Status: He is alert and oriented to person, place, and time.  Motor: No weakness.     Gait: Gait normal.  Psychiatric:        Behavior: Behavior normal.    ED Results / Procedures / Treatments   Labs (all labs ordered are listed, but only abnormal results are displayed) Labs Reviewed - No data to display  EKG None  Radiology No results found.  Procedures Procedures   Medications Ordered in ED Medications - No data to display  ED Course  I have reviewed the triage vital signs and the nursing notes.  Pertinent labs & imaging results that were available during my care of the patient were reviewed by me and considered in my medical decision making (see chart for details).  Clinical Course as of 07/22/21 1717  Sun Jul 22, 2021  5047 65 year old male with complaint of ongoing left eye redness, recently diagnosed by ophthalmology with some conjunctival hemorrhage.  On exam found to have some conjunctival hemorrhage, extraocular movements intact, pupils equal and reactive.  Reviewed ophthalmology notes with patient, recommend ongoing use of lubricating drops.  Discussed self-limiting condition which should resolve with time however if he has further concerns can follow-up with his ophthalmologist. [LM]    Clinical Course User Index [LM] Alden Hipp   MDM Rules/Calculators/A&P                            Final Clinical Impression(s) / ED Diagnoses Final diagnoses:  Subconjunctival hemorrhage of left eye    Rx / DC Orders ED Discharge Orders     None         Alden Hipp 07/22/21 1717    Cheryll Cockayne, MD 07/24/21 [phone]

## 2021-07-22 NOTE — ED Triage Notes (Signed)
Pt states he scratched his L eye 2 weeks ago and has been taking antibiotic.  Seen by ophthalmologist x 2 but states it is getting worse.  L eye drainage.

## 2021-07-22 NOTE — ED Notes (Signed)
Discharged by PA at triage. 

## 2021-07-22 NOTE — ED Notes (Signed)
Patient is being discharged from the Urgent Care and sent to the Emergency Department via POV . Per Dario Guardian, PA, patient is in need of higher level of care due to Worsening eye pain with headache. Patient is aware and verbalizes understanding of plan of care.  Vitals:   07/22/21 1416  BP: (!) 153/84  Pulse: (!) 50  Resp: 18  Temp: 98.5 F (36.9 C)  SpO2: 97%

## 2021-07-22 NOTE — ED Triage Notes (Addendum)
Patient c/o left eye pain from scratch, was seen on 07/05/21 for this and was placed on abx. Pt states he is having some drainage. Left eye is red and patient states he is still able to see from eye.

## 2021-07-22 NOTE — Discharge Instructions (Addendum)
Continue to use the lubricating drops from your eye doctor. Follow up with your ophthalmologist as needed.

## 2021-07-22 NOTE — ED Provider Notes (Signed)
Emergency Medicine Provider Triage Evaluation Note  Gerald Bradley , a 65 y.o. male  was evaluated in triage.  Pt complains of L eye pain and redness.  Patient states 2 weeks ago he was scratched on the left eye.  He has been seen in the ER and at the eye doctor for this, but states it continues to worsen.  He initially was put on antibiotics, but is no longer on that.  He denies vision loss.  Review of Systems  Positive: L eye pain Negative: Vision loss  Physical Exam  BP (!) 180/74 (BP Location: Right Arm)   Pulse (!) 52   Temp 98.6 F (37 C) (Oral)   Resp 19   SpO2 98%  Gen:   Awake, no distress   Resp:  Normal effort  MSK:   Moves extremities without difficulty  Other:  Left eye injected.  No clear periorbital swelling. EOMI and pERRLA  Medical Decision Making  Medically screening exam initiated at 3:42 PM.  Appropriate orders placed.  Gerald Bradley was informed that the remainder of the evaluation will be completed by another provider, this initial triage assessment does not replace that evaluation, and the importance of remaining in the ED until their evaluation is complete.     Alveria Apley, PA-C 07/22/21 1543    Cheryll Cockayne, MD 07/24/21 [phone]

## 2021-08-17 ENCOUNTER — Encounter (HOSPITAL_COMMUNITY): Payer: Self-pay

## 2021-08-17 ENCOUNTER — Other Ambulatory Visit: Payer: Self-pay

## 2021-08-17 ENCOUNTER — Ambulatory Visit (HOSPITAL_COMMUNITY)
Admission: EM | Admit: 2021-08-17 | Discharge: 2021-08-17 | Disposition: A | Discharge status: Home / Self Care | Payer: Medicare Other | Attending: Internal Medicine | Admitting: Internal Medicine

## 2021-08-17 DIAGNOSIS — B369 Superficial mycosis, unspecified: Secondary | ICD-10-CM | POA: Diagnosis not present

## 2021-08-17 MED ORDER — CLOTRIMAZOLE-BETAMETHASONE 1-0.05 % EX CREA: TOPICAL_CREAM | CUTANEOUS | 0 refills | Status: DC

## 2021-08-17 NOTE — ED Provider Notes (Signed)
[facility]    CSN: [id number] Arrival date & time: 08/17/21  0946      History   Chief Complaint Chief Complaint  Patient presents with   Rash    HPI Gerald Bradley is a 65 y.o. male comes to the urgent care with 1 week history of an itchy rash over the neck and also some itchy rash over the left eyebrow area.  Symptoms started insidiously and has worsened over the past week.  No significant erythema.  No fever or chills.   HPI  Past Medical History:  Diagnosis Date   Alcohol dependence (HCC)    Arthritis    Bradycardia    Cannabis abuse    Cardiomyopathy (HCC)    Cataract    Fatty liver    History of pneumothorax    History of rectal bleeding    Homelessness    Hypercholesterolemia    Nicotine dependence    Osteoarthritis    Overweight    Rectal bleeding    Hx of   SOB (shortness of breath)    Toe pain, right    Trapezius muscle spasm     Patient Active Problem List   Diagnosis Date Noted   Heavy alcohol use 08/29/2020   Gout 08/29/2020   Cardiomyopathy 04/19/2012   HYPERCHOLESTEROLEMIA 08/23/2009   BRADYCARDIA 07/06/2009   ARTHRITIS 07/06/2009   SHORTNESS OF BREATH 07/06/2009   RECTAL BLEEDING, HX OF 07/06/2009    Past Surgical History:  Procedure Laterality Date   LUNG SURGERY     resection for spontaneous pneumothorax skin graft   SKIN SURGERY         Home Medications    Prior to Admission medications   Medication Sig Start Date End Date Taking? Authorizing Provider  clotrimazole-betamethasone (LOTRISONE) cream Apply to affected area 2 times daily prn 08/17/21  Yes Travarus Trudo, Britta Mccreedy, MD  acetaminophen (TYLENOL) 500 MG tablet Take by mouth. 02/09/18   [provider]  allopurinol (ZYLOPRIM) 100 MG tablet Take 100 mg by mouth 2 (two) times daily.    [provider]  baclofen (LIORESAL) 10 MG tablet baclofen 10 mg tablet  TAKE 1 TABLET BY MOUTH TWICE DAILY AS NEEDED    [provider]  Buprenorphine  HCl-Naloxone HCl 8-2 MG FILM Suboxone 8 mg-2 mg sublingual film  DISSOLVE 1 FILM UNDER TONGUE TWICE DAILY AS NEEDED    [provider]  ergocalciferol (VITAMIN D2) 1.25 MG (50000 UT) capsule ergocalciferol (vitamin D2) 1,250 mcg (50,000 unit) capsule  TAKE 1 CAPSULE BY MOUTH WEEKLY    [provider]  esomeprazole (NEXIUM) 20 MG capsule Take by mouth. 12/17/17   [provider]  ibuprofen (ADVIL) 600 MG tablet ibuprofen 600 mg tablet  TAKE 1 TABLET BY MOUTH EVERY 6 HOURS    [provider]  lidocaine (LMX) 4 % cream Apply topically. 10/17/16   [provider]  losartan (COZAAR) 25 MG tablet Take 1 tablet (25 mg total) by mouth daily. 04/12/21   Meriam Sprague, MD  naloxone [facility]) nasal spray 4 mg/0.1 mL Narcan 4 mg/actuation nasal spray  USE 1 SPRAY AS DIRECTED IN EACH NOSTRIL    [provider]  naproxen (NAPROSYN) 500 MG tablet naproxen 500 mg tablet  TAKE 1 TABLET BY MOUTH TWICE DAILY    [provider]  oxycodone (OXY-IR) 5 MG capsule Take 5 mg by mouth every 4 (four) hours as needed.    [provider]  oxyCODONE-acetaminophen (PERCOCET) 7.5-325 MG  tablet Take 1 tablet by mouth every 4 (four) hours as needed for severe pain.    [provider]  polyethylene glycol (GOLYTELY) 236 g solution See directions from GI clinic. Mix with 1 gallon water. Drink half gallon the night before procedure and half gallon on morning of procedure 03/20/17   [provider]  predniSONE (DELTASONE) 20 MG tablet Take by mouth. 02/09/18   [provider]  rosuvastatin (CRESTOR) 10 MG tablet Take 1 tablet (10 mg total) by mouth daily. 10/10/20 07/05/21  Meriam Sprague, MD  trimethoprim-polymyxin b (POLYTRIM) ophthalmic solution Place 1 drop into the left eye every 4 (four) hours. Every 4 hours while awake for 7 days. 07/05/21   Rhys Martini, PA-C  omeprazole (PRILOSEC) 20 MG capsule Take 40 mg by mouth daily.   08/29/20  [provider]    Family History Family History  Problem Relation Age of Onset   Healthy Mother     Social History Social History   Tobacco Use   Smoking status: Former    Types: Cigarettes   Smokeless tobacco: Never  Vaping Use   Vaping Use: Never used  Substance Use Topics   Alcohol use: Yes    Alcohol/week: 6.0 standard drinks    Types: 6 Cans of beer per week    Comment: " i try to stay drunk to make the pain go away"   Drug use: Not Currently     Allergies   Iohexol   Review of Systems Review of Systems  Cardiovascular: Negative.   Gastrointestinal: Negative.   Musculoskeletal: Negative.   Skin:  Positive for color change and rash.    Physical Exam Triage Vital Signs ED Triage Vitals  Enc Vitals Group     BP 08/17/21 1029 (!) 148/79     Pulse Rate 08/17/21 1029 64     Resp 08/17/21 1029 16     Temp 08/17/21 1029 98.3 F (36.8 C)     Temp Source 08/17/21 1029 Oral     SpO2 08/17/21 1029 95 %     Weight --      Height --      Head Circumference --      Peak Flow --      Pain Score 08/17/21 1028 0     Pain Loc --      Pain Edu? --      Excl. in GC? --    No data found.  Updated Vital Signs BP (!) 148/79 (BP Location: Right Arm)   Pulse 64   Temp 98.3 F (36.8 C) (Oral)   Resp 16   SpO2 95%   Visual Acuity Right Eye Distance:   Left Eye Distance:   Bilateral Distance:    Right Eye Near:   Left Eye Near:    Bilateral Near:     Physical Exam Vitals and nursing note reviewed.  Constitutional:      General: He is not in acute distress.    Appearance: He is not ill-appearing.  Eyes:     Extraocular Movements: Extraocular movements intact.     Conjunctiva/sclera: Conjunctivae normal.  Musculoskeletal:        General: Normal range of motion.  Skin:    Comments: Circular rash on the anterior aspect of the neck.  Rash has some central clearing.  It is mildly erythematous.  Left supraorbital area is withOUT erythema.   Left supraorbital rash is mildly swollen.  Neurological:     Mental Status:  He is alert.     UC Treatments / Results  Labs (all labs ordered are listed, but only abnormal results are displayed) Labs Reviewed - No data to display  EKG   Radiology No results found.  Procedures Procedures (including critical care time)  Medications Ordered in UC Medications - No data to display  Initial Impression / Assessment and Plan / UC Course  I have reviewed the triage vital signs and the nursing notes.  Pertinent labs & imaging results that were available during my care of the patient were reviewed by me and considered in my medical decision making (see chart for details).     1.  Fungal dermatitis: Lotrisone cream to be applied twice daily to the areas affected If symptoms worsen patient is advised to return to the urgent care to be reevaluated. Patient verbalizes understanding of the plan of care. Final Clinical Impressions(s) / UC Diagnoses   Final diagnoses:  Fungal dermatitis     Discharge Instructions      Apply the cream to the affected area on the face twice daily for the next 3 days Apply to the neck until the rash on the neck is gone If you notice redness, drainage and swelling please return to urgent care to be reevaluated.   ED Prescriptions     Medication Sig Dispense Auth. Provider   clotrimazole-betamethasone (LOTRISONE) cream Apply to affected area 2 times daily prn 15 g Brynda Heick, Britta Mccreedy, MD      PDMP not reviewed this encounter.   Merrilee Jansky, MD 08/17/21 1925

## 2021-08-17 NOTE — ED Triage Notes (Signed)
Pt in with c/o rash to face and neck x 6 days   Pt has not taken any meds for sx

## 2021-08-17 NOTE — Discharge Instructions (Addendum)
Apply the cream to the affected area on the face twice daily for the next 3 days Apply to the neck until the rash on the neck is gone If you notice redness, drainage and swelling please return to urgent care to be reevaluated.

## 2021-10-04 ENCOUNTER — Other Ambulatory Visit: Payer: Self-pay | Admitting: Family Medicine

## 2021-10-04 DIAGNOSIS — R519 Headache, unspecified: Secondary | ICD-10-CM

## 2021-10-12 ENCOUNTER — Other Ambulatory Visit: Payer: Self-pay

## 2021-10-12 MED ORDER — ROSUVASTATIN CALCIUM 10 MG PO TABS: 10.0000 mg | ORAL_TABLET | Freq: Every day | ORAL | 0 refills | Status: DC

## 2021-10-21 ENCOUNTER — Other Ambulatory Visit: Payer: Self-pay

## 2021-10-21 ENCOUNTER — Ambulatory Visit
Admission: RE | Admit: 2021-10-21 | Discharge: 2021-10-21 | Disposition: A | Discharge status: Home / Self Care | Payer: Medicare Other | Source: Ambulatory Visit | Attending: Family Medicine | Admitting: Family Medicine

## 2021-10-21 DIAGNOSIS — R519 Headache, unspecified: Secondary | ICD-10-CM

## 2021-11-26 ENCOUNTER — Other Ambulatory Visit: Payer: Self-pay | Admitting: *Deleted

## 2021-11-26 MED ORDER — ROSUVASTATIN CALCIUM 10 MG PO TABS
10.0000 mg | ORAL_TABLET | Freq: Every day | ORAL | 0 refills | Status: AC
Start: 2021-11-26 — End: ?

## 2022-05-30 ENCOUNTER — Emergency Department (HOSPITAL_COMMUNITY): Payer: Medicare Other

## 2022-05-30 ENCOUNTER — Emergency Department (HOSPITAL_COMMUNITY)
Admission: EM | Admit: 2022-05-30 | Discharge: 2022-05-30 | Disposition: A | Discharge status: Home / Self Care | Payer: Medicare Other | Attending: Emergency Medicine | Admitting: Emergency Medicine

## 2022-05-30 ENCOUNTER — Other Ambulatory Visit: Payer: Self-pay

## 2022-05-30 DIAGNOSIS — B349 Viral infection, unspecified: Secondary | ICD-10-CM | POA: Diagnosis not present

## 2022-05-30 DIAGNOSIS — E878 Other disorders of electrolyte and fluid balance, not elsewhere classified: Secondary | ICD-10-CM | POA: Diagnosis not present

## 2022-05-30 DIAGNOSIS — Z20822 Contact with and (suspected) exposure to covid-19: Secondary | ICD-10-CM | POA: Diagnosis not present

## 2022-05-30 DIAGNOSIS — I429 Cardiomyopathy, unspecified: Secondary | ICD-10-CM | POA: Insufficient documentation

## 2022-05-30 DIAGNOSIS — R509 Fever, unspecified: Secondary | ICD-10-CM

## 2022-05-30 DIAGNOSIS — E876 Hypokalemia: Secondary | ICD-10-CM | POA: Diagnosis not present

## 2022-05-30 LAB — RESP PANEL BY RT-PCR (FLU A&B, COVID) ARPGX2
Influenza A by PCR: NEGATIVE
Influenza B by PCR: NEGATIVE
SARS Coronavirus 2 by RT PCR: NEGATIVE

## 2022-05-30 LAB — CBC WITH DIFFERENTIAL/PLATELET
Abs Immature Granulocytes: 0.01 10*3/uL (ref 0.00–0.07)
Basophils Absolute: 0 10*3/uL (ref 0.0–0.1)
Basophils Relative: 1 %
Eosinophils Absolute: 0 10*3/uL (ref 0.0–0.5)
Eosinophils Relative: 0 %
HCT: 39.7 % (ref 39.0–52.0)
Hemoglobin: 14.1 g/dL (ref 13.0–17.0)
Immature Granulocytes: 0 %
Lymphocytes Relative: 38 %
Lymphs Abs: 1.6 10*3/uL (ref 0.7–4.0)
MCH: 31.2 pg (ref 26.0–34.0)
MCHC: 35.5 g/dL (ref 30.0–36.0)
MCV: 87.8 fL (ref 80.0–100.0)
Monocytes Absolute: 0.7 10*3/uL (ref 0.1–1.0)
Monocytes Relative: 16 %
Neutro Abs: 1.9 10*3/uL (ref 1.7–7.7)
Neutrophils Relative %: 45 %
Platelets: 202 10*3/uL (ref 150–400)
RBC: 4.52 MIL/uL (ref 4.22–5.81)
RDW: 12.3 % (ref 11.5–15.5)
WBC: 4.2 10*3/uL (ref 4.0–10.5)
nRBC: 0 % (ref 0.0–0.2)

## 2022-05-30 LAB — URINALYSIS, ROUTINE W REFLEX MICROSCOPIC
Bacteria, UA: NONE SEEN
Bilirubin Urine: NEGATIVE
Glucose, UA: NEGATIVE mg/dL
Ketones, ur: NEGATIVE mg/dL
Leukocytes,Ua: NEGATIVE
Nitrite: NEGATIVE
Protein, ur: NEGATIVE mg/dL
Specific Gravity, Urine: 1.009 (ref 1.005–1.030)
pH: 7 (ref 5.0–8.0)

## 2022-05-30 LAB — COMPREHENSIVE METABOLIC PANEL
ALT: 18 U/L (ref 0–44)
AST: 26 U/L (ref 15–41)
Albumin: 3.4 g/dL — ABNORMAL LOW (ref 3.5–5.0)
Alkaline Phosphatase: 32 U/L — ABNORMAL LOW (ref 38–126)
Anion gap: 11 (ref 5–15)
BUN: 7 mg/dL — ABNORMAL LOW (ref 8–23)
CO2: 20 mmol/L — ABNORMAL LOW (ref 22–32)
Calcium: 8.5 mg/dL — ABNORMAL LOW (ref 8.9–10.3)
Chloride: 106 mmol/L (ref 98–111)
Creatinine, Ser: 1 mg/dL (ref 0.61–1.24)
GFR, Estimated: >60 mL/min (ref >60)
Glucose, Bld: 106 mg/dL — ABNORMAL HIGH (ref 70–99)
Potassium: 3 mmol/L — ABNORMAL LOW (ref 3.5–5.1)
Sodium: 137 mmol/L (ref 135–145)
Total Bilirubin: 1.8 mg/dL — ABNORMAL HIGH (ref 0.3–1.2)
Total Protein: 6.8 g/dL (ref 6.5–8.1)

## 2022-05-30 LAB — PROTIME-INR
INR: 1.1 (ref 0.8–1.2)
Prothrombin Time: 14 seconds (ref 11.4–15.2)

## 2022-05-30 LAB — LACTIC ACID, PLASMA: Lactic Acid, Venous: 0.9 mmol/L (ref 0.5–1.9)

## 2022-05-30 LAB — APTT: aPTT: 28 seconds (ref 24–36)

## 2022-05-30 MED ORDER — ACETAMINOPHEN 325 MG PO TABS
650.0000 mg | ORAL_TABLET | Freq: Once | ORAL | Status: AC
  Administered 2022-05-30: 650 mg via ORAL
  Filled 2022-05-30: qty 2

## 2022-05-30 MED ORDER — POTASSIUM CHLORIDE CRYS ER 20 MEQ PO TBCR
40.0000 meq | EXTENDED_RELEASE_TABLET | Freq: Once | ORAL | Status: AC
  Administered 2022-05-30: 40 meq via ORAL
  Filled 2022-05-30: qty 2

## 2022-05-30 MED ORDER — LACTATED RINGERS IV SOLN: INTRAVENOUS | Status: DC

## 2022-05-30 MED ORDER — SODIUM CHLORIDE 0.9 % IV BOLUS (SEPSIS)
1000.0000 mL | Freq: Once | INTRAVENOUS | Status: AC
  Administered 2022-05-30: 1000 mL via INTRAVENOUS

## 2022-05-30 MED ORDER — ONDANSETRON HCL 4 MG/2ML IJ SOLN
4.0000 mg | Freq: Once | INTRAMUSCULAR | Status: AC
  Administered 2022-05-30: 4 mg via INTRAVENOUS
  Filled 2022-05-30: qty 2

## 2022-05-30 NOTE — Discharge Instructions (Signed)
You were seen in the emergency department today for fever and body aches. Your labs showed low potassium so you were given potassium pills.  Otherwise it did not show any signs of bacterial infections including no pneumonia or urinary tract infection.  Your COVID and flu testing were negative. I think you likely have a viral illness that will resolve on its own.  Alternate Tylenol and Motrin every 3-4 hours as needed for fever/body aches.  Please follow-up with your primary care doctor within the next week.  Please return to the emergency department if you have persistent fevers, are unable to eat/drink, or have any other concerning symptoms.

## 2022-05-30 NOTE — ED Notes (Signed)
Discharge instructions reviewed with patient. Patient verbalized understanding of instructions. Follow-up care and medications were reviewed. Patient ambulatory with steady gait. VSS upon discharge.  ?

## 2022-05-30 NOTE — ED Provider Notes (Signed)
[facility]   CSN: [id number] Arrival date & time: 05/30/22  1923     History  Chief Complaint  Patient presents with   Fever    Earl GalaJohn Delman is a 66 y.o. 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  Patient states that the fever started yesterday and has persisted throughout the day.  With the fever he also developed diffuse myalgias, but states the worst pain is diffusely over his lower back.  He does endorse a mild sore throat and nausea.  Denies any cough, congestion, shortness of breath, chest pain, abdominal pain, vomiting/diarrhea, dysuria. He does endorse headache and notes a history of migraines. HA feels similar to his prior migraines and is intermittent. No associated neck pain.   Fever Associated symptoms: headaches, myalgias, nausea and sore throat   Associated symptoms: no chest pain, no cough, no diarrhea, no dysuria and no vomiting       Home Medications Prior to Admission medications   Medication Sig Start Date End Date Taking? Authorizing Provider  acetaminophen (TYLENOL) 500 MG tablet Take by mouth. 02/09/18   [provider]  allopurinol (ZYLOPRIM) 100 MG tablet Take 100 mg by mouth 2 (two) times daily.    [provider]  baclofen (LIORESAL) 10 MG tablet baclofen 10 mg tablet  TAKE 1 TABLET BY MOUTH TWICE DAILY AS NEEDED    [provider]  Buprenorphine HCl-Naloxone HCl 8-2 MG FILM Suboxone 8 mg-2 mg sublingual film  DISSOLVE 1 FILM UNDER TONGUE TWICE DAILY AS NEEDED    [provider]  clotrimazole-betamethasone (LOTRISONE) cream Apply to affected area 2 times daily prn 08/17/21   Lamptey, Britta MccreedyPhilip O, MD  ergocalciferol (VITAMIN D2) 1.25 MG (50000 UT) capsule ergocalciferol (vitamin D2) 1,250 mcg (50,000 unit) capsule  TAKE 1 CAPSULE BY MOUTH WEEKLY    [provider]   esomeprazole (NEXIUM) 20 MG capsule Take by mouth. 12/17/17   [provider]  ibuprofen (ADVIL) 600 MG tablet ibuprofen 600 mg tablet  TAKE 1 TABLET BY MOUTH EVERY 6 HOURS    [provider]  lidocaine (LMX) 4 % cream Apply topically. 10/17/16   [provider]  losartan (COZAAR) 25 MG tablet Take 1 tablet (25 mg total) by mouth daily. 04/12/21   Meriam SpraguePemberton, Heather E, MD  naloxone [facility](NARCAN) nasal spray 4 mg/0.1 mL Narcan 4 mg/actuation nasal spray  USE 1 SPRAY AS DIRECTED IN EACH NOSTRIL    [provider]  naproxen (NAPROSYN) 500 MG tablet naproxen 500 mg tablet  TAKE 1 TABLET BY MOUTH TWICE DAILY    [provider]  oxycodone (OXY-IR) 5 MG capsule Take 5 mg by mouth every 4 (four) hours as needed.    [provider]  oxyCODONE-acetaminophen (PERCOCET) 7.5-325 MG tablet Take 1 tablet by mouth every 4 (four) hours as needed for severe pain.    [provider]  polyethylene glycol (GOLYTELY) 236 g solution See directions from GI clinic. Mix with 1 gallon water. Drink half gallon the night before procedure and half gallon on morning of procedure 03/20/17   [provider]  predniSONE (DELTASONE) 20 MG tablet Take by mouth. 02/09/18   [provider]  rosuvastatin (CRESTOR) 10 MG tablet Take 1 tablet (10 mg total) by mouth daily. Please make overdue appt with Dr. Shari ProwsPemberton before anymore refills. Thank you 1st attempt 11/26/21  Meriam Sprague, MD  trimethoprim-polymyxin b (POLYTRIM) ophthalmic solution Place 1 drop into the left eye every 4 (four) hours. Every 4 hours while awake for 7 days. 07/05/21   Rhys Martini, PA-C  omeprazole (PRILOSEC) 20 MG capsule Take 40 mg by mouth daily.  08/29/20  [provider]      Allergies    Iohexol    Review of Systems   Review of Systems  Constitutional:  Positive for fever.  HENT:  Positive for sore throat.   Respiratory:  Negative for cough and shortness of  breath.   Cardiovascular:  Negative for chest pain.  Gastrointestinal:  Positive for nausea. Negative for abdominal pain, diarrhea and vomiting.  Genitourinary:  Negative for dysuria.  Musculoskeletal:  Positive for myalgias. Negative for neck pain.  Neurological:  Positive for headaches. Negative for seizures and syncope.   Physical Exam Updated Vital Signs BP (!) 122/56   Pulse 70   Temp (!) 102.7 F (39.3 C) (Oral)   Resp 19   Ht 5\' 9"  (1.753 m)   Wt 75 kg   SpO2 92%   BMI 24.42 kg/m  Physical Exam Vitals and nursing note reviewed.  Constitutional:      Appearance: He is ill-appearing. He is not toxic-appearing or diaphoretic.  HENT:     Head: Normocephalic and atraumatic.     Right Ear: External ear normal.     Left Ear: External ear normal.     Nose: Nose normal. No congestion or rhinorrhea.     Mouth/Throat:     Mouth: Mucous membranes are moist.     Pharynx: Oropharynx is clear.     Comments: Mild erythema over the posterior oropharynx. No exudates over the tonsils or oropharynx. Eyes:     General: No scleral icterus. Cardiovascular:     Rate and Rhythm: Normal rate and regular rhythm.     Heart sounds: Normal heart sounds. No murmur heard.   No friction rub. No gallop.  Pulmonary:     Effort: Pulmonary effort is normal. No respiratory distress.     Breath sounds: Normal breath sounds. No stridor. No wheezing, rhonchi or rales.  Abdominal:     Palpations: Abdomen is soft.     Tenderness: There is no abdominal tenderness. There is no right CVA tenderness, left CVA tenderness, guarding or rebound.  Musculoskeletal:        General: No deformity.     Cervical back: Neck supple. No rigidity or tenderness.     Right lower leg: No edema.     Left lower leg: No edema.     Comments: No midline T/L-spine tenderness.  Skin:    General: Skin is warm and dry.  Neurological:     General: No focal deficit present.     Mental Status: He is alert and oriented to person,  place, and time.    ED Results / Procedures / Treatments   Labs (all labs ordered are listed, but only abnormal results are displayed) Labs Reviewed  COMPREHENSIVE METABOLIC PANEL - Abnormal; Notable for the following components:      Result Value   Potassium 3.0 (*)    CO2 20 (*)    Glucose, Bld 106 (*)    BUN 7 (*)    Calcium 8.5 (*)    Albumin 3.4 (*)    Alkaline Phosphatase 32 (*)    Total Bilirubin 1.8 (*)    All other components within normal limits  URINALYSIS, ROUTINE W REFLEX  MICROSCOPIC - Abnormal; Notable for the following components:   Hgb urine dipstick SMALL (*)    All other components within normal limits  RESP PANEL BY RT-PCR (FLU A&B, COVID) ARPGX2  CULTURE, BLOOD (ROUTINE X 2)  CULTURE, BLOOD (ROUTINE X 2)  URINE CULTURE  LACTIC ACID, PLASMA  CBC WITH DIFFERENTIAL/PLATELET  PROTIME-INR  APTT  LACTIC ACID, PLASMA    EKG EKG Interpretation  Date/Time:  Thursday May 30 2022 20:36:07 EDT Ventricular Rate:  78 PR Interval:  138 QRS Duration: 92 QT Interval:  439 QTC Calculation: 443 R Axis:   75 Text Interpretation: Sinus rhythm Atrial premature complexes in couplets Probable left atrial enlargement Probable left ventricular hypertrophy atrial premature complexes new since previous Confirmed by Richardean Canal [phone]) on 05/30/2022 9:35:40 PM  Radiology DG Chest Port 1 View  Result Date: 05/30/2022 CLINICAL DATA:  Questionable sepsis EXAM: PORTABLE CHEST 1 VIEW COMPARISON:  01/27/2012 FINDINGS: Heart and mediastinal contours are within normal limits. No focal opacities or effusions. No acute bony abnormality. IMPRESSION: No active disease. Electronically Signed   By: Charlett Nose M.D.   On: 05/30/2022 21:24    Procedures Procedures    Medications Ordered in ED Medications  lactated ringers infusion ( Intravenous New Bag/Given 05/30/22 2220)  potassium chloride SA (KLOR-CON M) CR tablet 40 mEq (has no administration in time range)  sodium chloride 0.9 %  bolus 1,000 mL (0 mLs Intravenous Stopped 05/30/22 2326)  acetaminophen (TYLENOL) tablet 650 mg (650 mg Oral Given 05/30/22 2106)  ondansetron (ZOFRAN) injection 4 mg (4 mg Intravenous Given 05/30/22 2103)    ED Course/ Medical Decision Making/ A&P                           Medical Decision Making Amount and/or Complexity of Data Reviewed Labs: ordered. Radiology: ordered. ECG/medicine tests: ordered.  Risk OTC drugs. Prescription drug management.   Mat Stuard is a 66 y.o. 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  On arrival, patient is febrile to 102.60F but is mildly hypertensive with a normal heart rate.  On exam, he and his lungs are clear to auscultation in all lung fields.  His abdomen is soft nontender to palpation.  He has no CVA tenderness bilaterally.  He is endorsing diffuse lower back pain, but has no focal tenderness over the spine.  He also endorses a headache but no associated neck pain.  Differential includes viral illness, UTI, pneumonia.  Low suspicion for meningitis given patient has no meningismus.  No obvious open wounds on exam.  COVID/flu testing is negative.  Lactic acid is normal.  CMP notable for mild hypokalemia to 3.0 so patient was given p.o. repletion.  His bicarb is mildly low at 20, though he does state that he has not eaten much today so this may be some mild ketosis.  CBC without leukocytosis and with a normal hemoglobin of 14.1.  PT/INR normal.  UA is not concerning for infection.  Chest x-ray was obtained and does not show any focal consolidations or other abnormalities.  Given the patient's largely negative work-up, I think he likely has a viral illness.  I discussed the results of the patient's lab and imaging with him and have recommended alternating Tylenol and Motrin every 3-4 hours as needed for fever/body aches as well as close follow-up with his  PCP.  Patient is amenable to this plan.  Strict return precautions were discussed and the patient was discharged home in stable condition.        Final Clinical Impression(s) / ED Diagnoses Final diagnoses:  Viral illness  Fever, unspecified fever cause    Rx / DC Orders ED Discharge Orders     None         Laurence Compton, MD 05/30/22 2330    Charlynne Pander, MD 06/05/22 1525

## 2022-05-30 NOTE — ED Triage Notes (Signed)
BIB [facility] for body aches, fevers. Complains of bilateral knee pain, body aches, lower back pain, and fever. Started yesterday. No respiratory symptoms. No wounds reported or observed by this RN. No one close to patient is sick. Aox4.

## 2022-05-31 LAB — URINE CULTURE: Culture: NO GROWTH

## 2022-06-03 ENCOUNTER — Encounter (HOSPITAL_COMMUNITY): Payer: Self-pay

## 2022-06-03 ENCOUNTER — Ambulatory Visit (HOSPITAL_COMMUNITY)
Admission: EM | Admit: 2022-06-03 | Discharge: 2022-06-03 | Disposition: A | Discharge status: Home / Self Care | Payer: Medicare Other | Attending: Internal Medicine | Admitting: Internal Medicine

## 2022-06-03 DIAGNOSIS — J020 Streptococcal pharyngitis: Secondary | ICD-10-CM

## 2022-06-03 DIAGNOSIS — R001 Bradycardia, unspecified: Secondary | ICD-10-CM

## 2022-06-03 DIAGNOSIS — R070 Pain in throat: Secondary | ICD-10-CM

## 2022-06-03 LAB — POCT RAPID STREP A, ED / UC: Streptococcus, Group A Screen (Direct): POSITIVE — AB

## 2022-06-03 MED ORDER — AMOXICILLIN 500 MG PO CAPS: 500.0000 mg | ORAL_CAPSULE | Freq: Two times a day (BID) | ORAL | 0 refills | Status: AC

## 2022-06-03 NOTE — ED Triage Notes (Addendum)
Patient having allergy symptoms, body aches, and sore throat since last week. Patient was seen at ER and was diagnosed with a virus. Flu and Covid were negative.   Patient also complaining of new dizziness and SOB.

## 2022-06-03 NOTE — ED Provider Notes (Signed)
[facility]    CSN: [id number] Arrival date & time: 06/03/22  2130      History   Chief Complaint Chief Complaint  Patient presents with   Sore Throat   Generalized Body Aches   Dizziness    HPI Gerald Bradley is a 66 y.o. male.   Patient presents to urgent care for evaluation of sore throat that started Thursday May 30, 2022. He reports fever/chills at home with highest temperature being 103. He was seen in the ED on June 1st and was worked up for possible sepsis. Blood cultures were negative and so was flu/COVID-19 testing. ED did not do a swab for strep throat while patient was there. Patient reporting painful swallowing especially to the left side. Also reporting cough with yellow mucous for the lat 4 days as well. He has been taking tylenol and motrin over the counter for his fever/inflammation at home as instructed by ED physician. Last dose of tylenol was at 8:30am this morning. Patient is afebrile in clinica at this time. He reports intermittent nausea that develops into a headache for which he has been given    Sore Throat  Dizziness  Past Medical History:  Diagnosis Date   Alcohol dependence (HCC)    Arthritis    Bradycardia    Cannabis abuse    Cardiomyopathy (HCC)    Cataract    Fatty liver    History of pneumothorax    History of rectal bleeding    Homelessness    Hypercholesterolemia    Nicotine dependence    Osteoarthritis    Overweight    Rectal bleeding    Hx of   SOB (shortness of breath)    Toe pain, right    Trapezius muscle spasm     Patient Active Problem List   Diagnosis Date Noted   Heavy alcohol use 08/29/2020   Gout 08/29/2020   Cardiomyopathy 04/19/2012   HYPERCHOLESTEROLEMIA 08/23/2009   BRADYCARDIA 07/06/2009   ARTHRITIS 07/06/2009   SHORTNESS OF BREATH 07/06/2009   RECTAL BLEEDING, HX OF 07/06/2009    Past Surgical History:  Procedure Laterality Date   LUNG SURGERY     resection for spontaneous pneumothorax skin  graft   SKIN SURGERY         Home Medications    Prior to Admission medications   Medication Sig Start Date End Date Taking? Authorizing Provider  acetaminophen (TYLENOL) 500 MG tablet Take by mouth. 02/09/18  Yes [provider]  amoxicillin (AMOXIL) 500 MG capsule Take 1 capsule (500 mg total) by mouth 2 (two) times daily for 10 days. 06/03/22 06/13/22 Yes Maximino Cozzolino, Donavan Burnet, FNP  ibuprofen (ADVIL) 600 MG tablet ibuprofen 600 mg tablet  TAKE 1 TABLET BY MOUTH EVERY 6 HOURS   Yes [provider]  allopurinol (ZYLOPRIM) 100 MG tablet Take 100 mg by mouth 2 (two) times daily.    [provider]  baclofen (LIORESAL) 10 MG tablet baclofen 10 mg tablet  TAKE 1 TABLET BY MOUTH TWICE DAILY AS NEEDED    [provider]  Buprenorphine HCl-Naloxone HCl 8-2 MG FILM Suboxone 8 mg-2 mg sublingual film  DISSOLVE 1 FILM UNDER TONGUE TWICE DAILY AS NEEDED    [provider]  clotrimazole-betamethasone (LOTRISONE) cream Apply to affected area 2 times daily prn 08/17/21   Lamptey, Britta Mccreedy, MD  ergocalciferol (VITAMIN D2) 1.25 MG (50000 UT) capsule ergocalciferol (vitamin D2) 1,250 mcg (50,000 unit) capsule  TAKE 1 CAPSULE BY MOUTH WEEKLY  [provider]  esomeprazole (NEXIUM) 20 MG capsule Take by mouth. 12/17/17   [provider]  lidocaine (LMX) 4 % cream Apply topically. 10/17/16   [provider]  losartan (COZAAR) 25 MG tablet Take 1 tablet (25 mg total) by mouth daily. 04/12/21   Meriam Sprague, MD  naloxone [facility]) nasal spray 4 mg/0.1 mL Narcan 4 mg/actuation nasal spray  USE 1 SPRAY AS DIRECTED IN EACH NOSTRIL    [provider]  naproxen (NAPROSYN) 500 MG tablet naproxen 500 mg tablet  TAKE 1 TABLET BY MOUTH TWICE DAILY    [provider]  oxycodone (OXY-IR) 5 MG capsule Take 5 mg by mouth every 4 (four) hours as needed.    [provider]  oxyCODONE-acetaminophen (PERCOCET) 7.5-325 MG  tablet Take 1 tablet by mouth every 4 (four) hours as needed for severe pain.    [provider]  polyethylene glycol (GOLYTELY) 236 g solution See directions from GI clinic. Mix with 1 gallon water. Drink half gallon the night before procedure and half gallon on morning of procedure 03/20/17   [provider]  predniSONE (DELTASONE) 20 MG tablet Take by mouth. 02/09/18   [provider]  rosuvastatin (CRESTOR) 10 MG tablet Take 1 tablet (10 mg total) by mouth daily. Please make overdue appt with Dr. Shari Prows before anymore refills. Thank you 1st attempt 11/26/21   Meriam Sprague, MD  trimethoprim-polymyxin b [facility]) ophthalmic solution Place 1 drop into the left eye every 4 (four) hours. Every 4 hours while awake for 7 days. 07/05/21   Rhys Martini, PA-C  omeprazole (PRILOSEC) 20 MG capsule Take 40 mg by mouth daily.  08/29/20  [provider]    Family History Family History  Problem Relation Age of Onset   Healthy Mother     Social History Social History   Tobacco Use   Smoking status: Former    Types: Cigarettes   Smokeless tobacco: Never  Vaping Use   Vaping Use: Never used  Substance Use Topics   Alcohol use: Yes    Alcohol/week: 6.0 standard drinks    Types: 6 Cans of beer per week    Comment: " i try to stay drunk to make the pain go away"   Drug use: Not Currently     Allergies   Iohexol   Review of Systems Review of Systems  Neurological:  Positive for dizziness.  Per HPI  Physical Exam Triage Vital Signs ED Triage Vitals  Enc Vitals Group     BP 06/03/22 1131 (!) 152/90     Pulse Rate 06/03/22 1131 (!) 42     Resp 06/03/22 1131 16     Temp 06/03/22 1131 98.2 F (36.8 C)     Temp Source 06/03/22 1131 Oral     SpO2 06/03/22 1131 94 %     Weight 06/03/22 1134 170 lb (77.1 kg)     Height 06/03/22 1134  (1.778 m)     Head Circumference --      Peak Flow --      Pain Score 06/03/22 1134 7     Pain Loc --       Pain Edu? --      Excl. in GC? --    No data found.  Updated Vital Signs BP (!) 152/90 (BP Location: Left Arm)   Pulse (!) 42   Temp 98.2 F (36.8 C) (Oral)   Resp 16   Ht  (  1.778 m)   Wt 170 lb (77.1 kg)   SpO2 94%   BMI 24.39 kg/m   Visual Acuity Right Eye Distance:   Left Eye Distance:   Bilateral Distance:    Right Eye Near:   Left Eye Near:    Bilateral Near:     Physical Exam Vitals and nursing note reviewed.  Constitutional:      General: He is not in acute distress.    Appearance: Normal appearance. He is well-developed and normal weight. He is not ill-appearing or diaphoretic.     Comments: Very pleasant patient sitting comfortably on exam able in no acute distress.   HENT:     Head: Normocephalic and atraumatic.     Right Ear: Tympanic membrane, ear canal and external ear normal. Tympanic membrane is not erythematous.     Left Ear: Tympanic membrane, ear canal and external ear normal. Tympanic membrane is not erythematous.     Nose: Nose normal. No rhinorrhea.     Mouth/Throat:     Mouth: Mucous membranes are moist.     Pharynx: Posterior oropharyngeal erythema present.     Tonsils: Tonsillar exudate present. 1+ on the right. 1+ on the left.  Eyes:     General: Lids are normal. Vision grossly intact. Gaze aligned appropriately.     Extraocular Movements: Extraocular movements intact.     Conjunctiva/sclera: Conjunctivae normal.     Right eye: Right conjunctiva is not injected.     Left eye: Left conjunctiva is not injected.     Pupils: Pupils are equal, round, and reactive to light.  Cardiovascular:     Rate and Rhythm: Bradycardia present. Rhythm irregular.     Heart sounds: S1 normal and S2 normal. No murmur heard.   No friction rub. No gallop.  Pulmonary:     Effort: Pulmonary effort is normal. No respiratory distress.     Breath sounds: Normal breath sounds. No decreased air movement.  Abdominal:     General: Bowel sounds are normal.  There is no distension.     Palpations: Abdomen is soft.     Tenderness: There is no abdominal tenderness. There is no right CVA tenderness or left CVA tenderness.  Musculoskeletal:        General: No swelling.     Cervical back: Neck supple.     Right lower leg: No edema.     Left lower leg: No edema.  Lymphadenopathy:     Cervical: No cervical adenopathy.  Skin:    General: Skin is warm and dry.     Capillary Refill: Capillary refill takes less than 2 seconds.     Findings: No rash.  Neurological:     General: No focal deficit present.     Mental Status: He is alert and oriented to person, place, and time. Mental status is at baseline.     Gait: Gait is intact.  Psychiatric:        Attention and Perception: Attention and perception normal.        Mood and Affect: Mood normal.        Speech: Speech normal.        Behavior: Behavior normal. Behavior is cooperative.        Thought Content: Thought content normal.        Cognition and Memory: Cognition and memory normal.        Judgment: Judgment normal.     UC Treatments / Results  Labs (all labs ordered are listed,  but only abnormal results are displayed) Labs Reviewed  POCT RAPID STREP A, ED / UC - Abnormal; Notable for the following components:      Result Value   Streptococcus, Group A Screen (Direct) POSITIVE (*)    All other components within normal limits  TSH  COMPREHENSIVE METABOLIC PANEL    EKG   Radiology No results found.  Procedures Procedures (including critical care time)  Medications Ordered in UC Medications - No data to display  Initial Impression / Assessment and Plan / UC Course  I have reviewed the triage vital signs and the nursing notes.  Pertinent labs & imaging results that were available during my care of the patient were reviewed by me and considered in my medical decision making (see chart for details).   Patient is a 66 year old male presenting to urgent care with ongoing sore  throat, body aches, and fever. He was seen in the ED 05/30/22 with negative sepsis workup and diagnosed with viral URI. Patient was not tested for Group A strep I nthe ED. Symptoms have not improved and group A strep testing is positive today at urgent care. Plan to treat with amoxicillin twice daily for the next 10 days. Patient denies allergies to medications.   Patient's heart rate in clinic is 42 initially and 40 upon recheck.  Patient states that he has been feeling lightheaded for the past week, but denies nausea vomiting and diarrhea.  EKG from May 30, 2022 shows vermicular rate of 78 with atrial premature complexes in couplets.  Potassium in the ED was 3.0 and patient was given 40 mEq orally of potassium.  CMP and TSH drawn today to assess for further potassium abnormality or thyroid abnormality causing sinus bradycardia.  Patient has a history of bradycardia in the past as well.  Patient instructed to follow-up with his cardiologist outpatient for further evaluation.   Counseled patient regarding appropriate use of medications and potential side effects for all medications recommended or prescribed today. Discussed red flag signs and symptoms of worsening condition,when to call the PCP office, return to urgent care, and when to seek higher level of care. Patient verbalizes understanding and agreement with plan. All questions answered. Patient discharged in stable condition.   Final Clinical Impressions(s) / UC Diagnoses   Final diagnoses:  Acute streptococcal pharyngitis  Throat pain     Discharge Instructions      Take amoxicillin twice daily for the next 10 days to treat your strep throat.  Take this medication with food to avoid GI upset.  Continue to take Tylenol and Motrin with food for fever, inflammation, and pain.  Your EKG was normal in the clinic today, but I would like for you to follow-up with your cardiologist and your primary care provider regarding your slow heart rate.  If  you develop any new or worsening symptoms or do not improve in the next 2 to 3 days, please return.  If your symptoms are severe, please go to the emergency room.  Follow-up with your primary care provider for further evaluation and management of your symptoms as well as ongoing wellness visits.  I hope you feel better!     ED Prescriptions     Medication Sig Dispense Auth. Provider   amoxicillin (AMOXIL) 500 MG capsule Take 1 capsule (500 mg total) by mouth 2 (two) times daily for 10 days. 20 capsule Carlisle Beers, FNP      PDMP not reviewed this encounter.   Cherice Glennie,  Donavan Burnet, FNP 06/03/22 1258

## 2022-06-03 NOTE — Discharge Instructions (Addendum)
Take amoxicillin twice daily for the next 10 days to treat your strep throat.  Take this medication with food to avoid GI upset.  Continue to take Tylenol and Motrin with food for fever, inflammation, and pain.  Your EKG was normal in the clinic today, but I would like for you to follow-up with your cardiologist and your primary care provider regarding your slow heart rate.  If you develop any new or worsening symptoms or do not improve in the next 2 to 3 days, please return.  If your symptoms are severe, please go to the emergency room.  Follow-up with your primary care provider for further evaluation and management of your symptoms as well as ongoing wellness visits.  I hope you feel better!

## 2022-06-04 LAB — CULTURE, BLOOD (ROUTINE X 2)
Culture: NO GROWTH
Culture: NO GROWTH

## 2022-06-13 ENCOUNTER — Other Ambulatory Visit: Payer: Self-pay | Admitting: Student

## 2022-06-13 DIAGNOSIS — F17211 Nicotine dependence, cigarettes, in remission: Secondary | ICD-10-CM

## 2022-07-08 IMAGING — DX DG CHEST 1V PORT
1 series · 1 of 1 positions shown · non-contrast
Comparison: 01/27/2012

CLINICAL DATA: Questionable sepsis

EXAM:
PORTABLE CHEST 1 VIEW

[chest]
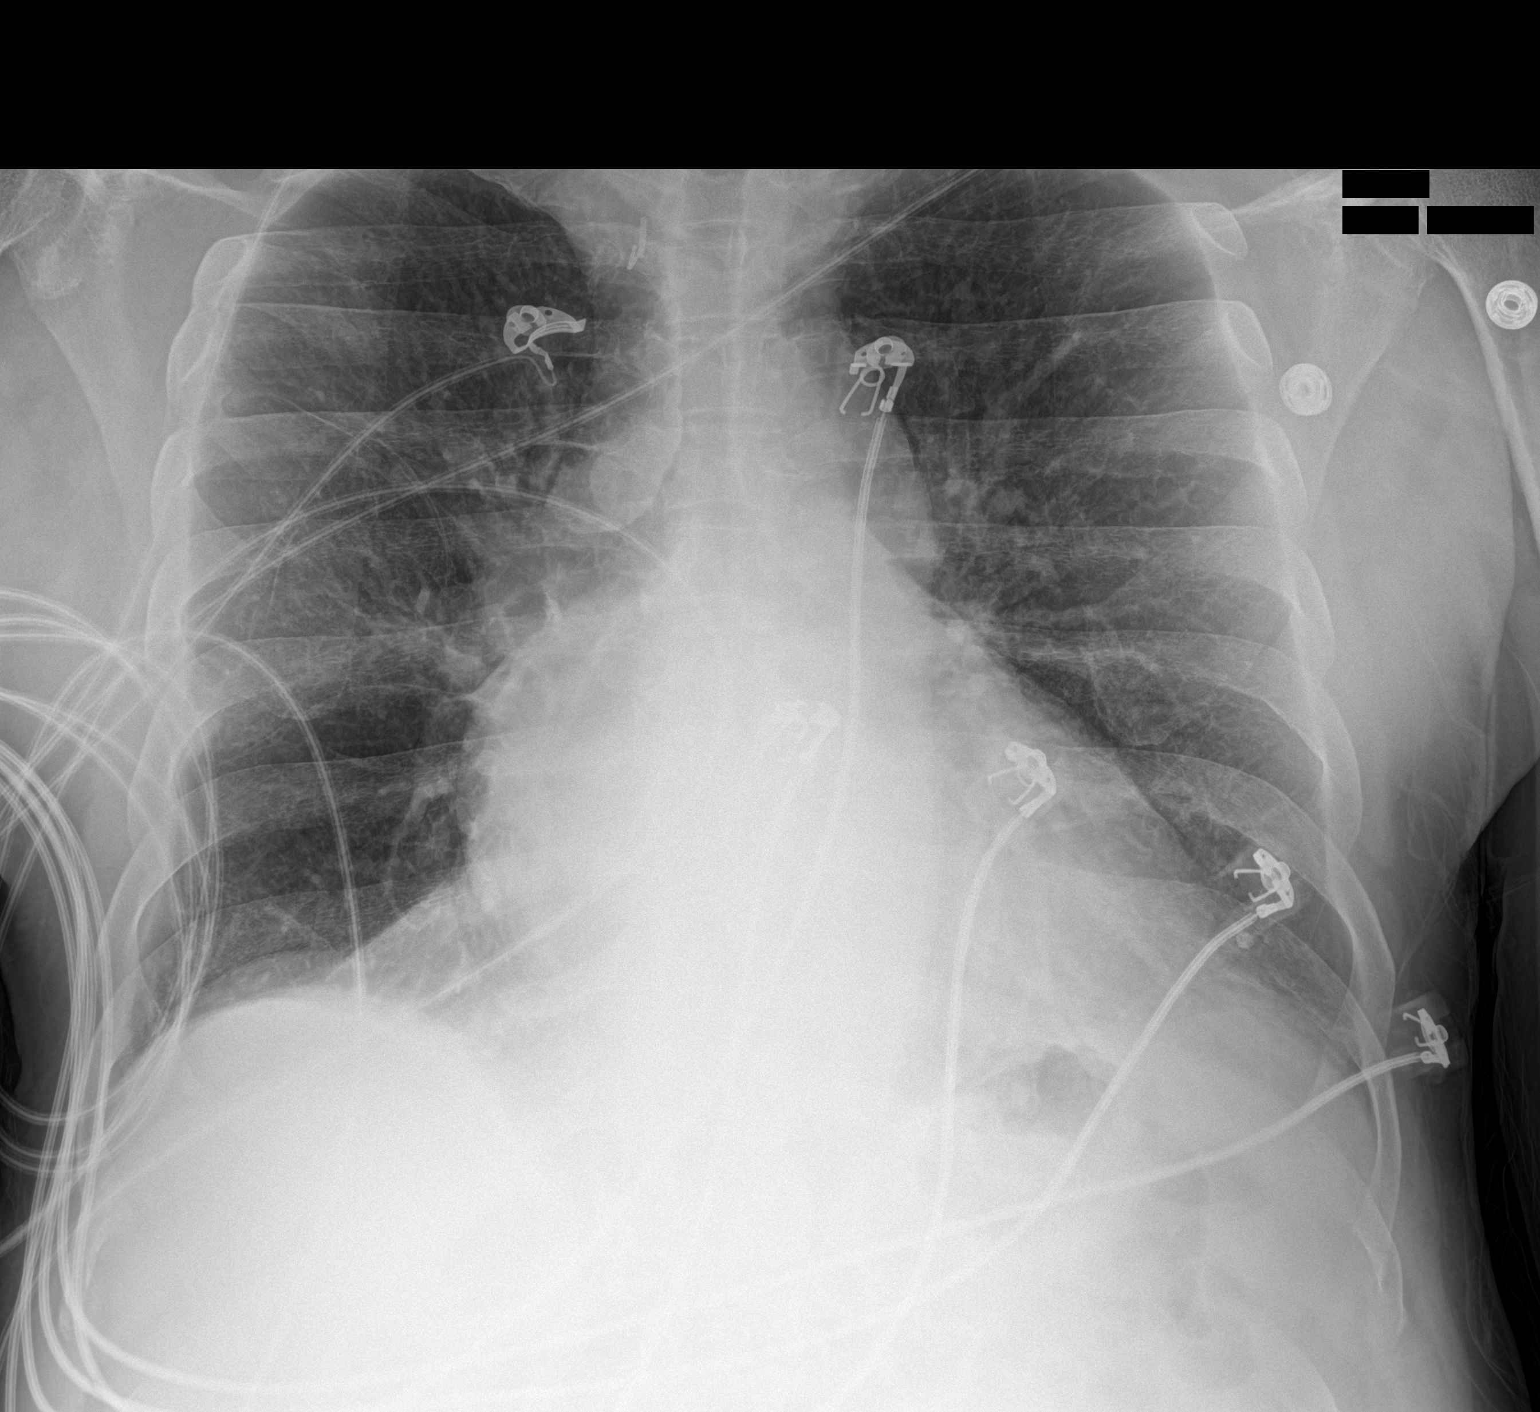

[1 of 1 positions shown; findings below may reference images not displayed]

FINDINGS: Heart and mediastinal contours are within normal limits. No focal
opacities or effusions. No acute bony abnormality.
IMPRESSION: No active disease.

## 2022-07-10 ENCOUNTER — Ambulatory Visit
Admission: RE | Admit: 2022-07-10 | Discharge: 2022-07-10 | Disposition: A | Discharge status: Home / Self Care | Payer: Medicare Other | Source: Ambulatory Visit | Attending: Student | Admitting: Student

## 2022-07-10 DIAGNOSIS — F17211 Nicotine dependence, cigarettes, in remission: Secondary | ICD-10-CM

## 2022-09-26 ENCOUNTER — Encounter (HOSPITAL_COMMUNITY): Payer: Self-pay

## 2022-09-26 ENCOUNTER — Ambulatory Visit (HOSPITAL_COMMUNITY)
Admission: EM | Admit: 2022-09-26 | Discharge: 2022-09-26 | Disposition: A | Discharge status: Home / Self Care | Payer: Medicare Other | Attending: Family Medicine | Admitting: Family Medicine

## 2022-09-26 DIAGNOSIS — S39012A Strain of muscle, fascia and tendon of lower back, initial encounter: Secondary | ICD-10-CM | POA: Diagnosis not present

## 2022-09-26 DIAGNOSIS — M62830 Muscle spasm of back: Secondary | ICD-10-CM | POA: Diagnosis not present

## 2022-09-26 MED ORDER — METHOCARBAMOL 500 MG PO TABS: 500.0000 mg | ORAL_TABLET | Freq: Three times a day (TID) | ORAL | 0 refills | Status: DC | PRN

## 2022-09-26 MED ORDER — PREDNISONE 10 MG (21) PO TBPK: ORAL_TABLET | Freq: Every day | ORAL | 0 refills | Status: DC

## 2022-09-26 NOTE — ED Triage Notes (Addendum)
Pt reports lower back pain x 4 days. Pt reports bilateral shoulder pain. Denies any trauma or falls.

## 2022-09-26 NOTE — Discharge Instructions (Addendum)
Instructed patient to take medication as directed with food to completion.  Advised patient to start this medication tomorrow morning Friday, 09/27/2022.  Advised may take Robaxin daily or as needed for accompanying muscle spasms of lower to mid back.  Encouraged patient to increase daily water intake to 64 ounces per day while taking these medications.

## 2022-09-26 NOTE — ED Provider Notes (Signed)
[facility]    CSN: [id number] Arrival date & time: 09/26/22  1643      History   Chief Complaint Chief Complaint  Patient presents with   Back Pain    HPI Gerald Bradley is a 66 y.o. male.   HPI Pleasant 66 year old male with presents with lower back pain for 4 days.  Additionally, reports bilateral shoulder pain for this time as well.  Patient denies injury or falls.  PMH significant for cardiomyopathy, alcohol dependence, cannabis abuse and osteoarthritis.  Past Medical History:  Diagnosis Date   Alcohol dependence (HCC)    Arthritis    Bradycardia    Cannabis abuse    Cardiomyopathy (HCC)    Cataract    Fatty liver    History of pneumothorax    History of rectal bleeding    Homelessness    Hypercholesterolemia    Nicotine dependence    Osteoarthritis    Overweight    Rectal bleeding    Hx of   SOB (shortness of breath)    Toe pain, right    Trapezius muscle spasm     Patient Active Problem List   Diagnosis Date Noted   Heavy alcohol use 08/29/2020   Gout 08/29/2020   Cardiomyopathy 04/19/2012   HYPERCHOLESTEROLEMIA 08/23/2009   BRADYCARDIA 07/06/2009   ARTHRITIS 07/06/2009   SHORTNESS OF BREATH 07/06/2009   RECTAL BLEEDING, HX OF 07/06/2009    Past Surgical History:  Procedure Laterality Date   LUNG SURGERY     resection for spontaneous pneumothorax skin graft   SKIN SURGERY         Home Medications    Prior to Admission medications   Medication Sig Start Date End Date Taking? Authorizing Provider  methocarbamol (ROBAXIN) 500 MG tablet Take 1 tablet (500 mg total) by mouth 3 (three) times daily as needed for muscle spasms. 09/26/22  Yes Gerald Iha, FNP  predniSONE (STERAPRED UNI-PAK 21 TAB) 10 MG (21) TBPK tablet Take by mouth daily. Take 6 tabs by mouth daily  for 2 days, then 5 tabs for 2 days, then 4 tabs for 2 days, then 3 tabs for 2 days, 2 tabs for 2 days, then 1 tab by mouth daily for 2 days 09/26/22  Yes Gerald Iha,  FNP  acetaminophen (TYLENOL) 500 MG tablet Take by mouth. 02/09/18   [provider]  allopurinol (ZYLOPRIM) 100 MG tablet Take 100 mg by mouth 2 (two) times daily.    [provider]  Buprenorphine HCl-Naloxone HCl 8-2 MG FILM Suboxone 8 mg-2 mg sublingual film  DISSOLVE 1 FILM UNDER TONGUE TWICE DAILY AS NEEDED    [provider]  clotrimazole-betamethasone (LOTRISONE) cream Apply to affected area 2 times daily prn 08/17/21   Lamptey, Britta Mccreedy, MD  ergocalciferol (VITAMIN D2) 1.25 MG (50000 UT) capsule ergocalciferol (vitamin D2) 1,250 mcg (50,000 unit) capsule  TAKE 1 CAPSULE BY MOUTH WEEKLY    [provider]  esomeprazole (NEXIUM) 20 MG capsule Take by mouth. 12/17/17   [provider]  ibuprofen (ADVIL) 600 MG tablet ibuprofen 600 mg tablet  TAKE 1 TABLET BY MOUTH EVERY 6 HOURS    [provider]  lidocaine (LMX) 4 % cream Apply topically. 10/17/16   [provider]  losartan (COZAAR) 25 MG tablet Take 1 tablet (25 mg total) by mouth daily. 04/12/21   Meriam Sprague, MD  naloxone [facility]) nasal spray 4 mg/0.1 mL Narcan 4 mg/actuation nasal spray  USE 1 SPRAY AS DIRECTED  IN EACH NOSTRIL    [provider]  naproxen (NAPROSYN) 500 MG tablet naproxen 500 mg tablet  TAKE 1 TABLET BY MOUTH TWICE DAILY    [provider]  oxycodone (OXY-IR) 5 MG capsule Take 5 mg by mouth every 4 (four) hours as needed.    [provider]  polyethylene glycol (GOLYTELY) 236 g solution See directions from GI clinic. Mix with 1 gallon water. Drink half gallon the night before procedure and half gallon on morning of procedure 03/20/17   [provider]  rosuvastatin (CRESTOR) 10 MG tablet Take 1 tablet (10 mg total) by mouth daily. Please make overdue appt with Dr. Shari Prows before anymore refills. Thank you 1st attempt 11/26/21   Meriam Sprague, MD  trimethoprim-polymyxin b [facility]) ophthalmic solution Place  1 drop into the left eye every 4 (four) hours. Every 4 hours while awake for 7 days. 07/05/21   Rhys Martini, PA-C  omeprazole (PRILOSEC) 20 MG capsule Take 40 mg by mouth daily.  08/29/20  [provider]    Family History Family History  Problem Relation Age of Onset   Healthy Mother     Social History Social History   Tobacco Use   Smoking status: Former    Types: Cigarettes   Smokeless tobacco: Never  Vaping Use   Vaping Use: Never used  Substance Use Topics   Alcohol use: Yes    Alcohol/week: 6.0 standard drinks of alcohol    Types: 6 Cans of beer per week    Comment: " i try to stay drunk to make the pain go away"   Drug use: Not Currently     Allergies   Iohexol   Review of Systems Review of Systems   Physical Exam Triage Vital Signs ED Triage Vitals  Enc Vitals Group     BP      Pulse      Resp      Temp      Temp src      SpO2      Weight      Height      Head Circumference      Peak Flow      Pain Score      Pain Loc      Pain Edu?      Excl. in GC?    No data found.  Updated Vital Signs BP (!) 123/102 (BP Location: Left Arm)   Pulse (!) 43   Temp 98.5 F (36.9 C) (Oral)   Resp 18   SpO2 100%    Physical Exam Vitals and nursing note reviewed.  Constitutional:      General: He is not in acute distress.    Appearance: He is obese. He is not ill-appearing.  HENT:     Head: Normocephalic and atraumatic.     Mouth/Throat:     Mouth: Mucous membranes are moist.     Pharynx: Oropharynx is clear.  Eyes:     Extraocular Movements: Extraocular movements intact.     Conjunctiva/sclera: Conjunctivae normal.     Pupils: Pupils are equal, round, and reactive to light.  Cardiovascular:     Rate and Rhythm: Normal rate and regular rhythm.     Pulses: Normal pulses.     Heart sounds: Normal heart sounds.  Pulmonary:     Effort: Pulmonary effort is normal.     Breath sounds: Normal breath sounds. No wheezing, rhonchi or rales.   Musculoskeletal:  General: Normal range of motion.     Cervical back: Normal range of motion and neck supple.     Comments: Lumbar sacral spine: TTP over bilateral paraspinous, superior spinal erector muscles, with palpable muscle adhesions noted   Skin:    General: Skin is warm and dry.  Neurological:     General: No focal deficit present.     Mental Status: He is alert and oriented to person, place, and time.     Sensory: No sensory deficit.     Motor: No weakness.     Gait: Gait normal.      UC Treatments / Results  Labs (all labs ordered are listed, but only abnormal results are displayed) Labs Reviewed - No data to display  EKG   Radiology No results found.  Procedures Procedures (including critical care time)  Medications Ordered in UC Medications - No data to display  Initial Impression / Assessment and Plan / UC Course  I have reviewed the triage vital signs and the nursing notes.  Pertinent labs & imaging results that were available during my care of the patient were reviewed by me and considered in my medical decision making (see chart for details).     MDM: 1. Strain of lumbar region, initial encounter-Rx Sterapred Unipak; 2.  Muscle spasms of back-Rx'd Robaxin. Instructed patient to take medication as directed with food to completion.  Advised patient to start this medication tomorrow morning Friday, 09/27/2022.  Advised may take Robaxin daily or as needed for accompanying muscle spasms of lower to mid back.  Encouraged patient to increase daily water intake to 64 ounces per day while taking these medications.  Patient discharged home, hemodynamically stable. Final Clinical Impressions(s) / UC Diagnoses   Final diagnoses:  Strain of lumbar region, initial encounter  Muscle spasm of back     Discharge Instructions      Instructed patient to take medication as directed with food to completion.  Advised patient to start this medication tomorrow  morning Friday, 09/27/2022.  Advised may take Robaxin daily or as needed for accompanying muscle spasms of lower to mid back.  Encouraged patient to increase daily water intake to 64 ounces per day while taking these medications.     ED Prescriptions     Medication Sig Dispense Auth. Provider   predniSONE (STERAPRED UNI-PAK 21 TAB) 10 MG (21) TBPK tablet Take by mouth daily. Take 6 tabs by mouth daily  for 2 days, then 5 tabs for 2 days, then 4 tabs for 2 days, then 3 tabs for 2 days, 2 tabs for 2 days, then 1 tab by mouth daily for 2 days 42 tablet Eliezer Lofts, FNP   methocarbamol (ROBAXIN) 500 MG tablet Take 1 tablet (500 mg total) by mouth 3 (three) times daily as needed for muscle spasms. 30 tablet Eliezer Lofts, FNP      PDMP not reviewed this encounter.   Eliezer Lofts, [place] 09/26/22 1907

## 2023-01-06 ENCOUNTER — Other Ambulatory Visit: Payer: Self-pay | Admitting: Student

## 2023-01-06 DIAGNOSIS — R252 Cramp and spasm: Secondary | ICD-10-CM

## 2023-01-15 ENCOUNTER — Ambulatory Visit
Admission: RE | Admit: 2023-01-15 | Discharge: 2023-01-15 | Disposition: A | Discharge status: Home / Self Care | Payer: Medicare Other | Source: Ambulatory Visit | Attending: Student | Admitting: Student

## 2023-01-15 DIAGNOSIS — R252 Cramp and spasm: Secondary | ICD-10-CM

## 2023-06-13 NOTE — Progress Notes (Deleted)
Office Visit Note  Patient: Gerald Bradley             Date of Birth: Dec 29, 1956           MRN: [id number]             PCP: Cain Saupe, MD Referring: Eliezer Lofts, MD Visit Date: 06/27/2023 Occupation: @GUAROCC @  Subjective:  No chief complaint on file.   History of Present Illness: Gerald Bradley is a 67 y.o. male ***     Activities of Daily Living:  Patient reports morning stiffness for *** {minute/hour:19697}.   Patient {ACTIONS;DENIES/REPORTS:21021675::"Denies"} nocturnal pain.  Difficulty dressing/grooming: {ACTIONS;DENIES/REPORTS:21021675::"Denies"} Difficulty climbing stairs: {ACTIONS;DENIES/REPORTS:21021675::"Denies"} Difficulty getting out of chair: {ACTIONS;DENIES/REPORTS:21021675::"Denies"} Difficulty using hands for taps, buttons, cutlery, and/or writing: {ACTIONS;DENIES/REPORTS:21021675::"Denies"}  No Rheumatology ROS completed.   PMFS History:  Patient Active Problem List   Diagnosis Date Noted   Heavy alcohol use 08/29/2020   Gout 08/29/2020   Cardiomyopathy 04/19/2012   HYPERCHOLESTEROLEMIA 08/23/2009   BRADYCARDIA 07/06/2009   ARTHRITIS 07/06/2009   SHORTNESS OF BREATH 07/06/2009   RECTAL BLEEDING, HX OF 07/06/2009    Past Medical History:  Diagnosis Date   Alcohol dependence (HCC)    Arthritis    Bradycardia    Cannabis abuse    Cardiomyopathy (HCC)    Cataract    Fatty liver    History of pneumothorax    History of rectal bleeding    Homelessness    Hypercholesterolemia    Nicotine dependence    Osteoarthritis    Overweight    Rectal bleeding    Hx of   SOB (shortness of breath)    Toe pain, right    Trapezius muscle spasm     Family History  Problem Relation Age of Onset   Healthy Mother    Past Surgical History:  Procedure Laterality Date   LUNG SURGERY     resection for spontaneous pneumothorax skin graft   SKIN SURGERY     Social History   Social History Narrative   Not on file   Immunization History   Administered Date(s) Administered   Influenza,inj,Quad PF,6+ Mos 12/16/2019   Pneumococcal Polysaccharide-23 12/17/2017   Tdap 12/16/2019     Objective: Vital Signs: There were no vitals taken for this visit.   Physical Exam   Musculoskeletal Exam: ***  CDAI Exam: CDAI Score: -- Patient Global: --; Provider Global: -- Swollen: --; Tender: -- Joint Exam 06/27/2023   No joint exam has been documented for this visit   There is currently no information documented on the homunculus. Go to the Rheumatology activity and complete the homunculus joint exam.  Investigation: No additional findings.  Imaging: No results found.  Recent Labs: Lab Results  Component Value Date   WBC 4.2 05/30/2022   HGB 14.1 05/30/2022   PLT 202 05/30/2022   NA 137 05/30/2022   K 3.0 (L) 05/30/2022   CL 106 05/30/2022   CO2 20 (L) 05/30/2022   GLUCOSE 106 (H) 05/30/2022   BUN 7 (L) 05/30/2022   CREATININE 1.00 05/30/2022   BILITOT 1.8 (H) 05/30/2022   ALKPHOS 32 (L) 05/30/2022   AST 26 05/30/2022   ALT 18 05/30/2022   PROT 6.8 05/30/2022   ALBUMIN 3.4 (L) 05/30/2022   CALCIUM 8.5 (L) 05/30/2022   GFRAA 105 11/07/2020    Speciality Comments: No specialty comments available.  Procedures:  No procedures performed Allergies: Iohexol   Assessment / Plan:     Visit Diagnoses: Positive ANA (antinuclear antibody) -  06/25/22: ANA+, Smith 2.2, centromere-,Jo1-, Ro-, dsDNA-, chromatin 1.4, La-, RNP 7.9, Scl-70-, ESR 4  Chronic right shoulder pain  Pain in right hand  History of gout  Hypercholesterolemia  Bradycardia  History of cardiomyopathy  Heavy alcohol use  History of gastroesophageal reflux (GERD)  Orders: No orders of the defined types were placed in this encounter.  No orders of the defined types were placed in this encounter.   Face-to-face time spent with patient was *** minutes. Greater than 50% of time was spent in counseling and coordination of  care.  Follow-Up Instructions: No follow-ups on file.   Gearldine Bienenstock, PA-C  Note - This record has been created using Dragon software.  Chart creation errors have been sought, but may not always  have been located. Such creation errors do not reflect on  the standard of medical care.

## 2023-06-27 ENCOUNTER — Encounter: Payer: Medicare Other | Admitting: Rheumatology

## 2023-06-27 DIAGNOSIS — M79641 Pain in right hand: Secondary | ICD-10-CM

## 2023-06-27 DIAGNOSIS — F1090 Alcohol use, unspecified, uncomplicated: Secondary | ICD-10-CM

## 2023-06-27 DIAGNOSIS — Z8739 Personal history of other diseases of the musculoskeletal system and connective tissue: Secondary | ICD-10-CM

## 2023-06-27 DIAGNOSIS — R001 Bradycardia, unspecified: Secondary | ICD-10-CM

## 2023-06-27 DIAGNOSIS — Z8679 Personal history of other diseases of the circulatory system: Secondary | ICD-10-CM

## 2023-06-27 DIAGNOSIS — R768 Other specified abnormal immunological findings in serum: Secondary | ICD-10-CM

## 2023-06-27 DIAGNOSIS — G8929 Other chronic pain: Secondary | ICD-10-CM

## 2023-06-27 DIAGNOSIS — E7800 Pure hypercholesterolemia, unspecified: Secondary | ICD-10-CM

## 2023-06-27 DIAGNOSIS — Z8719 Personal history of other diseases of the digestive system: Secondary | ICD-10-CM

## 2023-07-24 ENCOUNTER — Ambulatory Visit: Payer: Medicare Other | Admitting: Rheumatology

## 2023-09-08 ENCOUNTER — Ambulatory Visit (HOSPITAL_COMMUNITY)
Admission: EM | Admit: 2023-09-08 | Discharge: 2023-09-08 | Disposition: A | Discharge status: Home / Self Care | Payer: Medicare Other | Attending: Internal Medicine | Admitting: Internal Medicine

## 2023-09-08 ENCOUNTER — Encounter (HOSPITAL_COMMUNITY): Payer: Self-pay

## 2023-09-08 DIAGNOSIS — S46912A Strain of unspecified muscle, fascia and tendon at shoulder and upper arm level, left arm, initial encounter: Secondary | ICD-10-CM

## 2023-09-08 MED ORDER — NAPROXEN 375 MG PO TABS: 375.0000 mg | ORAL_TABLET | Freq: Two times a day (BID) | ORAL | 0 refills | Status: DC

## 2023-09-08 NOTE — Discharge Instructions (Addendum)
Rest  the affected painful area.   Apply warm compresses intermittently as needed. Please apply the warm compress only 20 minutes on-20 minutes or schedule Please take medications as prescribed As pain recedes, begin normal activities slowly as tolerated. Please return to urgent care if symptoms worsen call if symptoms persist.

## 2023-09-08 NOTE — ED Triage Notes (Signed)
Here for a 2 week history of left shoulder pain. Denies any recent injuries or falls.

## 2023-09-08 NOTE — ED Provider Notes (Signed)
[facility]    CSN: [id number] Arrival date & time: 09/08/23  1612      History   Chief Complaint Chief Complaint  Patient presents with   Shoulder Pain    HPI Gerald Bradley is a 67 y.o. male comes to the urgent care with 2-week history of left shoulder pain.  Patient denies any trauma to the left shoulder.  Pain is sharp and aggravated by raising his arm up.  No known relieving factors.  Pain resolves if he does not raise his arm over his head.  He denies any falls or trauma to the left shoulder.  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  No bruising on the left shoulder.  No radiation of pain. HPI  Past Medical History:  Diagnosis Date   Alcohol dependence (HCC)    Arthritis    Bradycardia    Cannabis abuse    Cardiomyopathy (HCC)    Cataract    Fatty liver    History of pneumothorax    History of rectal bleeding    Homelessness    Hypercholesterolemia    Nicotine dependence    Osteoarthritis    Overweight    Rectal bleeding    Hx of   SOB (shortness of breath)    Toe pain, right    Trapezius muscle spasm     Patient Active Problem List   Diagnosis Date Noted   Heavy alcohol use 08/29/2020   Gout 08/29/2020   Cardiomyopathy 04/19/2012   HYPERCHOLESTEROLEMIA 08/23/2009   BRADYCARDIA 07/06/2009   ARTHRITIS 07/06/2009   SHORTNESS OF BREATH 07/06/2009   RECTAL BLEEDING, HX OF 07/06/2009    Past Surgical History:  Procedure Laterality Date   LUNG SURGERY     resection for spontaneous pneumothorax skin graft   SKIN SURGERY         Home Medications    Prior to Admission medications   Medication Sig Start Date End Date Taking? Authorizing Provider  allopurinol (ZYLOPRIM) 100 MG tablet Take 100 mg by mouth 2 (two) times daily.   Yes [provider]  Buprenorphine HCl-Naloxone HCl 8-2 MG FILM Suboxone 8 mg-2 mg sublingual film  DISSOLVE 1 FILM UNDER TONGUE TWICE DAILY AS NEEDED   Yes [provider]  ergocalciferol (VITAMIN D2) 1.25 MG (50000 UT) capsule ergocalciferol (vitamin D2) 1,250 mcg (50,000 unit) capsule  TAKE 1 CAPSULE BY MOUTH WEEKLY   Yes [provider]  esomeprazole (NEXIUM) 20 MG capsule Take by mouth. 12/17/17  Yes [provider]  losartan (COZAAR) 25 MG tablet Take 1 tablet (25 mg total) by mouth daily. 04/12/21  Yes Meriam Sprague, MD  methocarbamol (ROBAXIN) 500 MG tablet Take 1 tablet (500 mg total) by mouth 3 (three) times daily as needed for muscle spasms. 09/26/22  Yes Trevor Iha, FNP  naproxen (NAPROSYN) 375 MG tablet Take 1 tablet (375 mg total) by mouth 2 (two) times daily. 09/08/23  Yes Daquana Paddock, Britta Mccreedy, MD  oxycodone (OXY-IR) 5 MG capsule Take 5 mg by mouth every 4 (four) hours as needed.   Yes [provider]  rosuvastatin (CRESTOR) 10 MG tablet Take 1 tablet (10 mg total) by mouth daily. Please make overdue appt with Dr. Shari Prows before anymore refills. Thank you 1st attempt 11/26/21  Yes Meriam Sprague, MD  acetaminophen (TYLENOL) 500 MG tablet Take by mouth. 02/09/18   [provider]  clotrimazole-betamethasone (LOTRISONE) cream Apply  to affected area 2 times daily prn 08/17/21   Merrilee Jansky, MD  lidocaine (LMX) 4 % cream Apply topically. 10/17/16   [provider]  naloxone (NARCAN) nasal spray 4 mg/0.1 mL Narcan 4 mg/actuation nasal spray  USE 1 SPRAY AS DIRECTED IN EACH NOSTRIL    [provider]  polyethylene glycol (GOLYTELY) 236 g solution See directions from GI clinic. Mix with 1 gallon water. Drink half gallon the night before procedure and half gallon on morning of procedure 03/20/17   [provider]  trimethoprim-polymyxin b (POLYTRIM) ophthalmic solution Place 1 drop into the left eye every 4 (four) hours. Every 4 hours while awake for 7 days. 07/05/21   Rhys Martini, PA-C  omeprazole (PRILOSEC) 20 MG capsule Take 40 mg by mouth daily.  08/29/20  [provider]    Family History Family History  Problem Relation Age of Onset   Healthy Mother     Social History Social History   Tobacco Use   Smoking status: Former    Types: Cigarettes   Smokeless tobacco: Never  Vaping Use   Vaping status: Never Used  Substance Use Topics   Alcohol use: Yes    Alcohol/week: 6.0 standard drinks of alcohol    Types: 6 Cans of beer per week    Comment: " i try to stay drunk to make the pain go away"   Drug use: Not Currently     Allergies   Iohexol   Review of Systems Review of Systems As per HPI  Physical Exam Triage Vital Signs ED Triage Vitals [09/08/23 1902]  Encounter Vitals Group     BP (!) 145/81     Systolic BP Percentile      Diastolic BP Percentile      Pulse Rate (!) 53     Resp 16     Temp 98.1 F (36.7 C)     Temp Source Oral     SpO2 97 %     Weight      Height      Head Circumference      Peak Flow      Pain Score      Pain Loc      Pain Education      Exclude from Growth Chart    No data found.  Updated Vital Signs BP (!) 145/81 (BP Location: Left Arm)   Pulse (!) 53   Temp 98.1 F (36.7 C) (Oral)   Resp 16   SpO2 97%   Visual Acuity Right Eye Distance:   Left Eye Distance:   Bilateral Distance:    Right Eye Near:   Left Eye Near:    Bilateral Near:     Physical Exam Vitals and nursing note reviewed.  Constitutional:      General: He is not in acute distress.    Appearance: He is not ill-appearing.  Cardiovascular:     Rate and Rhythm: Normal rate and regular rhythm.  Musculoskeletal:        General: Normal range of motion.     Comments: Point tenderness over the deltoid muscle of the left shoulder.  No bruising noted.  Skin:    General: Skin is warm.     Findings: No bruising.  Neurological:     Mental Status: He is alert.      UC Treatments / Results  Labs (all labs ordered are listed, but only abnormal results are displayed) Labs Reviewed - No data  to  display  EKG   Radiology No results found.  Procedures Procedures (including critical care time)  Medications Ordered in UC Medications - No data to display  Initial Impression / Assessment and Plan / UC Course  I have reviewed the triage vital signs and the nursing notes.  Pertinent labs & imaging results that were available during my care of the patient were reviewed by me and considered in my medical decision making (see chart for details).     1.  Muscle strain of the left shoulder: Naproxen 375 mg twice daily Patient is advised to apply warm compress only 20 minutes on-20 minutes off cycle Gentle range of motion exercises Patient is advised to return to urgent care if symptoms worsen or persist. No indication for imaging at this time. Final Clinical Impressions(s) / UC Diagnoses   Final diagnoses:  Muscle strain, shoulder region, left, initial encounter     Discharge Instructions      Rest  the affected painful area.   Apply warm compresses intermittently as needed. Please apply the warm compress only 20 minutes on-20 minutes or schedule Please take medications as prescribed As pain recedes, begin normal activities slowly as tolerated. Please return to urgent care if symptoms worsen call if symptoms persist.    ED Prescriptions     Medication Sig Dispense Auth. Provider   naproxen (NAPROSYN) 375 MG tablet Take 1 tablet (375 mg total) by mouth 2 (two) times daily. 20 tablet Sinjin Amero, Britta Mccreedy, MD      PDMP not reviewed this encounter.   Merrilee Jansky, MD 09/08/23 [phone]

## 2023-10-02 ENCOUNTER — Inpatient Hospital Stay: Payer: Medicare Other

## 2023-10-02 ENCOUNTER — Inpatient Hospital Stay: Payer: Medicare Other | Attending: Hematology | Admitting: Hematology

## 2023-10-02 VITALS — BP 145/74 | HR 72 | Temp 97.8°F | Resp 16 | Wt 174.9 lb

## 2023-10-02 DIAGNOSIS — Z79899 Other long term (current) drug therapy: Secondary | ICD-10-CM | POA: Diagnosis not present

## 2023-10-02 DIAGNOSIS — R222 Localized swelling, mass and lump, trunk: Secondary | ICD-10-CM | POA: Diagnosis not present

## 2023-10-02 DIAGNOSIS — D709 Neutropenia, unspecified: Secondary | ICD-10-CM | POA: Insufficient documentation

## 2023-10-02 DIAGNOSIS — Z114 Encounter for screening for human immunodeficiency virus [HIV]: Secondary | ICD-10-CM | POA: Diagnosis not present

## 2023-10-02 DIAGNOSIS — R519 Headache, unspecified: Secondary | ICD-10-CM | POA: Diagnosis not present

## 2023-10-02 DIAGNOSIS — Z87891 Personal history of nicotine dependence: Secondary | ICD-10-CM | POA: Insufficient documentation

## 2023-10-02 DIAGNOSIS — M109 Gout, unspecified: Secondary | ICD-10-CM | POA: Insufficient documentation

## 2023-10-02 DIAGNOSIS — F1090 Alcohol use, unspecified, uncomplicated: Secondary | ICD-10-CM | POA: Diagnosis not present

## 2023-10-02 LAB — CMP (CANCER CENTER ONLY)
ALT: 19 U/L (ref 0–44)
AST: 22 U/L (ref 15–41)
Albumin: 4.6 g/dL (ref 3.5–5.0)
Alkaline Phosphatase: 46 U/L (ref 38–126)
Anion gap: 9 (ref 5–15)
BUN: 12 mg/dL (ref 8–23)
CO2: 26 mmol/L (ref 22–32)
Calcium: 10 mg/dL (ref 8.9–10.3)
Chloride: 107 mmol/L (ref 98–111)
Creatinine: 0.91 mg/dL (ref 0.61–1.24)
GFR, Estimated: >60 mL/min (ref >60)
Glucose, Bld: 88 mg/dL (ref 70–99)
Potassium: 3.9 mmol/L (ref 3.5–5.1)
Sodium: 142 mmol/L (ref 135–145)
Total Bilirubin: 1.5 mg/dL — ABNORMAL HIGH (ref 0.3–1.2)
Total Protein: 8.1 g/dL (ref 6.5–8.1)

## 2023-10-02 LAB — CBC WITH DIFFERENTIAL (CANCER CENTER ONLY)
Abs Immature Granulocytes: 0 10*3/uL (ref 0.00–0.07)
Basophils Absolute: 0 10*3/uL (ref 0.0–0.1)
Basophils Relative: 1 %
Eosinophils Absolute: 0.2 10*3/uL (ref 0.0–0.5)
Eosinophils Relative: 3 %
HCT: 46.1 % (ref 39.0–52.0)
Hemoglobin: 16.4 g/dL (ref 13.0–17.0)
Immature Granulocytes: 0 %
Lymphocytes Relative: 24 %
Lymphs Abs: 1.2 10*3/uL (ref 0.7–4.0)
MCH: 31.5 pg (ref 26.0–34.0)
MCHC: 35.6 g/dL (ref 30.0–36.0)
MCV: 88.5 fL (ref 80.0–100.0)
Monocytes Absolute: 0.2 10*3/uL (ref 0.1–1.0)
Monocytes Relative: 4 %
Neutro Abs: 3.4 10*3/uL (ref 1.7–7.7)
Neutrophils Relative %: 68 %
Platelet Count: 231 10*3/uL (ref 150–400)
RBC: 5.21 MIL/uL (ref 4.22–5.81)
RDW: 12.9 % (ref 11.5–15.5)
WBC Count: 5.1 10*3/uL (ref 4.0–10.5)
nRBC: 0 % (ref 0.0–0.2)

## 2023-10-02 LAB — VITAMIN B12: Vitamin B-12: 200 pg/mL (ref 180–914)

## 2023-10-02 LAB — FOLATE: Folate: 21.3 ng/mL (ref >5.9)

## 2023-10-02 LAB — HEPATITIS C ANTIBODY: HCV Ab: NONREACTIVE

## 2023-10-02 NOTE — Progress Notes (Signed)
HEMATOLOGY/ONCOLOGY CONSULTATION NOTE  Date of Service: 10/02/2023  Patient Care Team: Cain Saupe, MD as PCP - General (Family Medicine)  CHIEF COMPLAINTS/PURPOSE OF CONSULTATION:  Evaluation and management of neutropenia  HISTORY OF PRESENTING ILLNESS:   Gerald Bradley is a wonderful 67 y.o. male who has been referred to [place] by Francisca December, MD for evaluation and management of neutropenia.   Patient presented to urgent care on 09/08/2023 for left shoulder pain which occurred randomly. He denies any injury to the area. He was given Ibuprofen and Baclofen , which did not improve pain. He reports that he takes ibuprofen moderately to prevent the risk of ulcers.   His back issues have improved, though he complains of neck aching at this time.   Patient denies any abdominal pain, fever, chills, unexpected weight loss, viral infections, or infections requiring antibiotics.   He notes that he leans on his right shoulder frequently. 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   He complains of feet tingling, but denies any symptoms in his hands. Patient has never been diagnosed with neuropathy.   His p.o. intake is minimal and he denies adequate levels of fruits and vegetables in his diet.   Patient has never required blood transfusions.  Patient has a history of excessive alcohol consumption. He notes that he has been a heavy drinker for all his life.  Patient notes that he previously consumed a 12-pack of beer a day, and sometimes less. He has recently decreased his alcohol consumption to a few 40-ounce bottles a day. Patient denies any other alcohol consumption besides beer. He reports trying marijuana for a brief period, but has discontinued. Patient denies any other drug use. He has not been previously enrolled into a rehab facility for his excess alcohol use. Patient denies any history of liver cirrhosis.   Patient takes one Centrum  multivitamin daily. He does take vitamin C supplements once in while. He regularly takes Topamax for management of headaches, in addition to statins, Losartan, Baclofen muscle relaxants, and Allopurinol for gout. No other new medicaions over the last 6 months.   Patient reports having a lump in his upper back for 1 year, which is not bothersome. He complains of sinus issues and lightheadedness. He reports burning his left forarm, which has healed at this time.   His gout affects his feet with pain and swelling symptoms. He has been taking Aloopurinol for 1-2 years and has not had any flare-ups since then. He also takes muscle relaxants, but denies any Colchicine or steroid use.   His heart function has been stable and is managed by his PCP.  MEDICAL HISTORY:  Past Medical History:  Diagnosis Date   Alcohol dependence (HCC)    Arthritis    Bradycardia    Cannabis abuse    Cardiomyopathy (HCC)    Cataract    Fatty liver    History of pneumothorax    History of rectal bleeding    Homelessness    Hypercholesterolemia    Nicotine dependence    Osteoarthritis    Overweight    Rectal bleeding    Hx of   SOB (shortness of breath)    Toe pain, right    Trapezius muscle spasm     SURGICAL HISTORY: Past Surgical History:  Procedure Laterality Date   LUNG SURGERY     resection for spontaneous pneumothorax skin graft   SKIN SURGERY  SOCIAL HISTORY: Social History   Socioeconomic History   Marital status: Single    Spouse name: Not on file   Number of children: Not on file   Years of education: Not on file   Highest education level: Not on file  Occupational History   Not on file  Tobacco Use   Smoking status: Former    Types: Cigarettes   Smokeless tobacco: Never  Vaping Use   Vaping status: Never Used  Substance and Sexual Activity   Alcohol use: Yes    Alcohol/week: 6.0 standard drinks of alcohol    Types: 6 Cans of beer per week    Comment: " i try to stay drunk  to make the pain go away"   Drug use: Not Currently   Sexual activity: Not Currently  Other Topics Concern   Not on file  Social History Narrative   Not on file   Social Determinants of Health   Financial Resource Strain: Not on file  Food Insecurity: Not on file  Transportation Needs: Not on file  Physical Activity: Not on file  Stress: Not on file  Social Connections: Unknown (05/14/2022)   Received from [facility], [facility]   Social Network    Social Network: Not on file  Intimate Partner Violence: Unknown (04/05/2022)   Received from [facility], [facility]   HITS    Physically Hurt: Not on file    Insult or Talk Down To: Not on file    Threaten Physical Harm: Not on file    Scream or Curse: Not on file    FAMILY HISTORY: Family History  Problem Relation Age of Onset   Healthy Mother     ALLERGIES:  is allergic to iohexol.  MEDICATIONS:  Current Outpatient Medications  Medication Sig Dispense Refill   acetaminophen (TYLENOL) 500 MG tablet Take by mouth.     allopurinol (ZYLOPRIM) 100 MG tablet Take 100 mg by mouth 2 (two) times daily.     Buprenorphine HCl-Naloxone HCl 8-2 MG FILM Suboxone 8 mg-2 mg sublingual film  DISSOLVE 1 FILM UNDER TONGUE TWICE DAILY AS NEEDED     clotrimazole-betamethasone (LOTRISONE) cream Apply to affected area 2 times daily prn 15 g 0   ergocalciferol (VITAMIN D2) 1.25 MG (50000 UT) capsule ergocalciferol (vitamin D2) 1,250 mcg (50,000 unit) capsule  TAKE 1 CAPSULE BY MOUTH WEEKLY     esomeprazole (NEXIUM) 20 MG capsule Take by mouth.     lidocaine (LMX) 4 % cream Apply topically.     losartan (COZAAR) 25 MG tablet Take 1 tablet (25 mg total) by mouth daily. 30 tablet 5   methocarbamol (ROBAXIN) 500 MG tablet Take 1 tablet (500 mg total) by mouth 3 (three) times daily as needed for muscle spasms. 30 tablet 0   naloxone (NARCAN) nasal spray 4 mg/0.1 mL Narcan 4 mg/actuation nasal spray  USE 1 SPRAY AS DIRECTED IN EACH NOSTRIL      naproxen (NAPROSYN) 375 MG tablet Take 1 tablet (375 mg total) by mouth 2 (two) times daily. 20 tablet 0   oxycodone (OXY-IR) 5 MG capsule Take 5 mg by mouth every 4 (four) hours as needed.     polyethylene glycol (GOLYTELY) 236 g solution See directions from GI clinic. Mix with 1 gallon water. Drink half gallon the night before procedure and half gallon on morning of procedure     rosuvastatin (CRESTOR) 10 MG tablet Take 1 tablet (10 mg total) by mouth daily. Please make overdue appt  with Dr. Shari Prows before anymore refills. Thank you 1st attempt 15 tablet 0   trimethoprim-polymyxin b (POLYTRIM) ophthalmic solution Place 1 drop into the left eye every 4 (four) hours. Every 4 hours while awake for 7 days. 10 mL 0   No current facility-administered medications for this visit.    REVIEW OF SYSTEMS:    10 Point review of Systems was done is negative except as noted above.  PHYSICAL EXAMINATION: ECOG PERFORMANCE STATUS: 2 - Symptomatic, <50% confined to bed  . Vitals:   10/02/23 1140  BP: (!) 145/74  Pulse: 72  Resp: 16  Temp: 97.8 F (36.6 C)  SpO2: 97%   Filed Weights   10/02/23 1140  Weight: 174 lb 14.4 oz (79.3 kg)   .Body mass index is 25.1 kg/m.  GENERAL:alert, in no acute distress and comfortable SKIN: no acute rashes, no significant lesions EYES: conjunctiva are pink and non-injected, sclera anicteric OROPHARYNX: MMM, no exudates, no oropharyngeal erythema or ulceration NECK: supple, no JVD LYMPH:  no palpable lymphadenopathy in the cervical, axillary or inguinal regions LUNGS: clear to auscultation b/l with normal respiratory effort HEART: regular rate & rhythm ABDOMEN:  normoactive bowel sounds , non tender, not distended.  Spleen is just palpable Extremity: no pedal edema PSYCH: alert & oriented x 3 with fluent speech NEURO: no focal motor/sensory deficits  LABORATORY DATA:  I have reviewed the data as listed  09/03/2023 CBC:     CMP  09/03/2023:   Marland Kitchen    Latest Ref Rng & Units 10/02/2023   12:59 PM 05/30/2022    9:13 PM 10/14/2011   12:04 PM  CBC  WBC 4.0 - 10.5 K/uL 5.1  4.2    Hemoglobin 13.0 - 17.0 g/dL 16.1  09.6  04.5   Hematocrit 39.0 - 52.0 % 46.1  39.7  44.0   Platelets 150 - 400 K/uL 231  202     .CBC    Component Value Date/Time   WBC 5.1 10/02/2023 1259   WBC 4.2 05/30/2022 2113   RBC 5.21 10/02/2023 1259   HGB 16.4 10/02/2023 1259   HCT 46.1 10/02/2023 1259   PLT 231 10/02/2023 1259   MCV 88.5 10/02/2023 1259   MCH 31.5 10/02/2023 1259   MCHC 35.6 10/02/2023 1259   RDW 12.9 10/02/2023 1259   LYMPHSABS 1.2 10/02/2023 1259   MONOABS 0.2 10/02/2023 1259   EOSABS 0.2 10/02/2023 1259   BASOSABS 0.0 10/02/2023 1259    .    Latest Ref Rng & Units 10/02/2023   12:59 PM 05/30/2022    9:13 PM 11/07/2020    9:46 AM  CMP  Glucose 70 - 99 mg/dL 88  409  89   BUN 8 - 23 mg/dL 12  7  10    Creatinine 0.61 - 1.24 mg/dL 8.11  9.14  7.82   Sodium 135 - 145 mmol/L 142  137  142   Potassium 3.5 - 5.1 mmol/L 3.9  3.0  4.2   Chloride 98 - 111 mmol/L 107  106  105   CO2 22 - 32 mmol/L 26  20  21    Calcium 8.9 - 10.3 mg/dL 95.6  8.5  9.5   Total Protein 6.5 - 8.1 g/dL 8.1  6.8    Total Bilirubin 0.3 - 1.2 mg/dL 1.5  1.8    Alkaline Phos 38 - 126 U/L 46  32    AST 15 - 41 U/L 22  26    ALT 0 - 44 U/L  19  18       RADIOGRAPHIC STUDIES: I have personally reviewed the radiological images as listed and agreed with the findings in the report. No results found.  ASSESSMENT & PLAN:  67 y.o. male with  Neutropenia likely due to alcohol abuse and potentially nutritional deficiencies and medications (Topamax, Allopurinol).  PLAN:  -Discussed lab results from 09/03/2023 in detail with patient. CBC showed WBC of 4.6K with 15% neutrophils, hemoglobin of 15.0, and platelets of 252K. -patient is neutropenic, total WBCs are normal Labs done today show normal CBC with resolution of neutropenia might suggest this could  be primarily driven by his alcohol use with improvement in the context of decreased alcohol intake. -US abdomen 02/27/2012 showed that the liver was diffusely increased in echogenicity. There is concern that his liver changes may have progressed to liver cirrhosis  -encouraged alcohol cessation -discussed concerns of a combination of long history of alcohol use, subtle nutritional deficiencies, and medications including Topamax and Allopurinol which may cause blood count suppression -educated patient that alcohol may affect nerve/tingling, heart problems, and potentially liver problems -would also recommend alcohol cessation due to interactions with Topamax.  -will order blood tests today to evaluate for any nutritional deficiencies -recommend [place] of liver and spleen -recommend taking a vitamin B complex daily -lump in upper back is likely a sebaceous cyst or lipoma. There may be a role for PCP to refer patient to a surgeon if it becomes bothersome.   FOLLOW-UP: Labs today [place] abd in 1 week RTC with Dr Candise Che in 2 weeks  The total time spent in the appointment was 60 minutes* .  All of the patient's questions were answered with apparent satisfaction. The patient knows to call the clinic with any problems, questions or concerns.   Wyvonnia Lora MD MS AAHIVMS [facility] Hematology/Oncology Physician [facility]  .*Total Encounter Time as defined by the Centers for Medicare and Medicaid Services includes, in addition to the face-to-face time of a patient visit (documented in the note above) non-face-to-face time: obtaining and reviewing outside history, ordering and reviewing medications, tests or procedures, care coordination (communications with other health care professionals or caregivers) and documentation in the medical record.    I,Mitra Faeizi,acting as a Neurosurgeon for Wyvonnia Lora, MD.,have documented all relevant documentation on the behalf of Wyvonnia Lora, MD,as directed by  Wyvonnia Lora, MD while in the presence of Wyvonnia Lora, MD.  .I have reviewed the above documentation for accuracy and completeness, and I agree with the above. Johney Maine MD

## 2023-10-06 LAB — VITAMIN B6: Vitamin B6: 43.5 ug/L (ref 3.4–65.2)

## 2023-10-06 LAB — VITAMIN B1: Vitamin B1 (Thiamine): 109.3 nmol/L (ref 66.5–200.0)

## 2023-10-07 ENCOUNTER — Ambulatory Visit (HOSPITAL_COMMUNITY)
Admission: RE | Admit: 2023-10-07 | Discharge: 2023-10-07 | Disposition: A | Discharge status: Home / Self Care | Payer: Medicare Other | Source: Ambulatory Visit | Attending: Hematology | Admitting: Hematology

## 2023-10-07 DIAGNOSIS — D709 Neutropenia, unspecified: Secondary | ICD-10-CM | POA: Insufficient documentation

## 2023-10-15 ENCOUNTER — Inpatient Hospital Stay (HOSPITAL_BASED_OUTPATIENT_CLINIC_OR_DEPARTMENT_OTHER): Payer: Medicare Other | Admitting: Hematology

## 2023-10-15 VITALS — BP 143/82 | HR 73 | Temp 98.1°F | Resp 18 | Wt 174.5 lb

## 2023-10-15 DIAGNOSIS — D709 Neutropenia, unspecified: Secondary | ICD-10-CM

## 2023-10-15 NOTE — Progress Notes (Signed)
HEMATOLOGY/ONCOLOGY CLINIC NOTE  Date of Service: 10/15/2023  Patient Care Team: Cain Saupe, MD as PCP - General (Family Medicine)  CHIEF COMPLAINTS/PURPOSE OF CONSULTATION:  Evaluation and management of neutropenia  HISTORY OF PRESENTING ILLNESS:   Gerald Bradley is a wonderful 67 y.o. male who has been referred to [place] by Francisca December, MD for evaluation and management of neutropenia.   Patient presented to urgent care on 09/08/2023 for left shoulder pain which occurred randomly. He denies any injury to the area. He was given Ibuprofen and Baclofen , which did not improve pain. He reports that he takes ibuprofen moderately to prevent the risk of ulcers.   His back issues have improved, though he complains of neck aching at this time.   Patient denies any abdominal pain, fever, chills, unexpected weight loss, viral infections, or infections requiring antibiotics.   He notes that he leans on his right shoulder frequently. 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   He complains of feet tingling, but denies any symptoms in his hands. Patient has never been diagnosed with neuropathy.   His p.o. intake is minimal and he denies adequate levels of fruits and vegetables in his diet.   Patient has never required blood transfusions.  Patient has a history of excessive alcohol consumption. He notes that he has been a heavy drinker for all his life.  Patient notes that he previously consumed a 12-pack of beer a day, and sometimes less. He has recently decreased his alcohol consumption to a few 40-ounce bottles a day. Patient denies any other alcohol consumption besides beer. He reports trying marijuana for a brief period, but has discontinued. Patient denies any other drug use. He has not been previously enrolled into a rehab facility for his excess alcohol use. Patient denies any history of liver cirrhosis.   Patient takes one Centrum  multivitamin daily. He does take vitamin C supplements once in while. He regularly takes Topamax for management of headaches, in addition to statins, Losartan, Baclofen muscle relaxants, and Allopurinol for gout. No other new medicaions over the last 6 months.   Patient reports having a lump in his upper back for 1 year, which is not bothersome. He complains of sinus issues and lightheadedness. He reports burning his left forarm, which has healed at this time.   His gout affects his feet with pain and swelling symptoms. He has been taking Aloopurinol for 1-2 years and has not had any flare-ups since then. He also takes muscle relaxants, but denies any Colchicine or steroid use.   His heart function has been stable and is managed by his PCP.  INTERVAL HISTORY:  Gerald Bradley is a 67 y.o. male here for continued evaluation and management of neutropenia. He was last seen by me on 10/02/2023 and reported neck ache, feet tingling, minimal p.o. intake, lump in upper back, sinus issues, lightheadedness, gout pain/swelling in feet, and shoulder pain.   Today, he reports no new symptoms since his last visit. He reports that his alcohol consumption is lower than previously and notes that he is losing taste for it. Patient denies any back pain, abdominal pain, or leg swelling.   MEDICAL HISTORY:  Past Medical History:  Diagnosis Date   Alcohol dependence (HCC)    Arthritis    Bradycardia    Cannabis abuse    Cardiomyopathy (HCC)    Cataract    Fatty liver    History  of pneumothorax    History of rectal bleeding    Homelessness    Hypercholesterolemia    Nicotine dependence    Osteoarthritis    Overweight    Rectal bleeding    Hx of   SOB (shortness of breath)    Toe pain, right    Trapezius muscle spasm     SURGICAL HISTORY: Past Surgical History:  Procedure Laterality Date   LUNG SURGERY     resection for spontaneous pneumothorax skin graft   SKIN SURGERY      SOCIAL  HISTORY: Social History   Socioeconomic History   Marital status: Single    Spouse name: Not on file   Number of children: Not on file   Years of education: Not on file   Highest education level: Not on file  Occupational History   Not on file  Tobacco Use   Smoking status: Former    Types: Cigarettes   Smokeless tobacco: Never  Vaping Use   Vaping status: Never Used  Substance and Sexual Activity   Alcohol use: Yes    Alcohol/week: 6.0 standard drinks of alcohol    Types: 6 Cans of beer per week    Comment: " i try to stay drunk to make the pain go away"   Drug use: Not Currently   Sexual activity: Not Currently  Other Topics Concern   Not on file  Social History Narrative   Not on file   Social Determinants of Health   Financial Resource Strain: Not on file  Food Insecurity: Not on file  Transportation Needs: Not on file  Physical Activity: Not on file  Stress: Not on file  Social Connections: Unknown (05/14/2022)   Received from [facility]   Social Network    Social Network: Not on file  Intimate Partner Violence: Unknown (04/05/2022)   Received from [facility], [facility]   HITS    Physically Hurt: Not on file    Insult or Talk Down To: Not on file    Threaten Physical Harm: Not on file    Scream or Curse: Not on file    FAMILY HISTORY: Family History  Problem Relation Age of Onset   Healthy Mother     ALLERGIES:  is allergic to iohexol.  MEDICATIONS:  Current Outpatient Medications  Medication Sig Dispense Refill   acetaminophen (TYLENOL) 500 MG tablet Take by mouth.     allopurinol (ZYLOPRIM) 100 MG tablet Take 100 mg by mouth 2 (two) times daily.     Buprenorphine HCl-Naloxone HCl 8-2 MG FILM Suboxone 8 mg-2 mg sublingual film  DISSOLVE 1 FILM UNDER TONGUE TWICE DAILY AS NEEDED     clotrimazole-betamethasone (LOTRISONE) cream Apply to affected area 2 times daily prn 15 g 0   ergocalciferol (VITAMIN D2) 1.25 MG (50000 UT)  capsule ergocalciferol (vitamin D2) 1,250 mcg (50,000 unit) capsule  TAKE 1 CAPSULE BY MOUTH WEEKLY     esomeprazole (NEXIUM) 20 MG capsule Take by mouth.     lidocaine (LMX) 4 % cream Apply topically.     losartan (COZAAR) 25 MG tablet Take 1 tablet (25 mg total) by mouth daily. 30 tablet 5   methocarbamol (ROBAXIN) 500 MG tablet Take 1 tablet (500 mg total) by mouth 3 (three) times daily as needed for muscle spasms. 30 tablet 0   naloxone (NARCAN) nasal spray 4 mg/0.1 mL Narcan 4 mg/actuation nasal spray  USE 1 SPRAY AS DIRECTED IN EACH NOSTRIL  naproxen (NAPROSYN) 375 MG tablet Take 1 tablet (375 mg total) by mouth 2 (two) times daily. 20 tablet 0   oxycodone (OXY-IR) 5 MG capsule Take 5 mg by mouth every 4 (four) hours as needed.     polyethylene glycol (GOLYTELY) 236 g solution See directions from GI clinic. Mix with 1 gallon water. Drink half gallon the night before procedure and half gallon on morning of procedure     rosuvastatin (CRESTOR) 10 MG tablet Take 1 tablet (10 mg total) by mouth daily. Please make overdue appt with Dr. Shari Prows before anymore refills. Thank you 1st attempt 15 tablet 0   trimethoprim-polymyxin b (POLYTRIM) ophthalmic solution Place 1 drop into the left eye every 4 (four) hours. Every 4 hours while awake for 7 days. 10 mL 0   No current facility-administered medications for this visit.    REVIEW OF SYSTEMS:    10 Point review of Systems was done is negative except as noted above.   PHYSICAL EXAMINATION: ECOG PERFORMANCE STATUS: 2 - Symptomatic, <50% confined to bed  . Vitals:   10/15/23 1421  BP: (!) 143/82  Pulse: 73  Resp: 18  Temp: 98.1 F (36.7 C)  SpO2: 98%    Filed Weights   10/15/23 1421  Weight: 174 lb 8 oz (79.2 kg)    .Body mass index is 25.04 kg/m.   GENERAL:alert, in no acute distress and comfortable SKIN: no acute rashes, no significant lesions EYES: conjunctiva are pink and non-injected, sclera anicteric OROPHARYNX:  MMM, no exudates, no oropharyngeal erythema or ulceration NECK: supple, no JVD LYMPH:  no palpable lymphadenopathy in the cervical, axillary or inguinal regions LUNGS: clear to auscultation b/l with normal respiratory effort HEART: regular rate & rhythm ABDOMEN:  normoactive bowel sounds , non tender, not distended. No palpable hepatosplenomegaly. Extremity: no pedal edema PSYCH: alert & oriented x 3 with fluent speech NEURO: no focal motor/sensory deficits   LABORATORY DATA:  I have reviewed the data as listed  09/03/2023 CBC:     CMP 09/03/2023:   Marland Kitchen    Latest Ref Rng & Units 10/02/2023   12:59 PM 05/30/2022    9:13 PM 10/14/2011   12:04 PM  CBC  WBC 4.0 - 10.5 K/uL 5.1  4.2    Hemoglobin 13.0 - 17.0 g/dL 16.1  09.6  04.5   Hematocrit 39.0 - 52.0 % 46.1  39.7  44.0   Platelets 150 - 400 K/uL 231  202     .CBC    Component Value Date/Time   WBC 5.1 10/02/2023 1259   WBC 4.2 05/30/2022 2113   RBC 5.21 10/02/2023 1259   HGB 16.4 10/02/2023 1259   HCT 46.1 10/02/2023 1259   PLT 231 10/02/2023 1259   MCV 88.5 10/02/2023 1259   MCH 31.5 10/02/2023 1259   MCHC 35.6 10/02/2023 1259   RDW 12.9 10/02/2023 1259   LYMPHSABS 1.2 10/02/2023 1259   MONOABS 0.2 10/02/2023 1259   EOSABS 0.2 10/02/2023 1259   BASOSABS 0.0 10/02/2023 1259    .    Latest Ref Rng & Units 10/02/2023   12:59 PM 05/30/2022    9:13 PM 11/07/2020    9:46 AM  CMP  Glucose 70 - 99 mg/dL 88  409  89   BUN 8 - 23 mg/dL 12  7  10    Creatinine 0.61 - 1.24 mg/dL 8.11  9.14  7.82   Sodium 135 - 145 mmol/L 142  137  142   Potassium 3.5 -  5.1 mmol/L 3.9  3.0  4.2   Chloride 98 - 111 mmol/L 107  106  105   CO2 22 - 32 mmol/L 26  20  21    Calcium 8.9 - 10.3 mg/dL 82.9  8.5  9.5   Total Protein 6.5 - 8.1 g/dL 8.1  6.8    Total Bilirubin 0.3 - 1.2 mg/dL 1.5  1.8    Alkaline Phos 38 - 126 U/L 46  32    AST 15 - 41 U/L 22  26    ALT 0 - 44 U/L 19  18       RADIOGRAPHIC STUDIES: I have personally reviewed the  radiological images as listed and agreed with the findings in the report. US Abdomen Complete  Result Date: 10/07/2023 CLINICAL DATA:  Chronic alcohol abuse. EXAM: ABDOMEN ULTRASOUND COMPLETE COMPARISON:  CT chest 07/10/2022 FINDINGS: Gallbladder: Cholelithiasis. No gallbladder wall thickening or pericholecystic fluid. Negative sonographic Murphy's sign. Common bile duct: Diameter: 3.4 mm Liver: Increased echogenicity. No focal lesion. Portal vein is patent on color Doppler imaging with normal direction of blood flow towards the liver. IVC: No abnormality visualized. Pancreas: Visualized portion unremarkable. Spleen: Size and appearance within normal limits. Right Kidney: Length: 10.8 cm. Echogenicity within normal limits. No mass or hydronephrosis visualized. Left Kidney: Length: 10.5 cm. Echogenicity within normal limits. No mass or hydronephrosis visualized. Abdominal aorta: No aneurysm visualized. Other findings: None. IMPRESSION: 1. Cholelithiasis without secondary signs of acute cholecystitis. 2. Increased hepatic parenchymal echogenicity suggestive of steatosis. Electronically Signed   By: Annia Belt M.D.   On: 10/07/2023 21:55    ASSESSMENT & PLAN:  67 y.o. male with  Neutropenia likely due to alcohol abuse and potentially nutritional deficiencies and medications (Topamax, Allopurinol).  PLAN:  -Discussed lab results from 10/02/2023 in detail with patient. CBC showed WBC of 5.1K, hemoglobin of 16.4, and platelets of 231K. -WBCs have normalized, patient is no longer neutropenic -most likely that his previous neutropenia was secondary to major alcohol abuse or potentially a nutritional deficiency or medications including Topamax or Allopurinol  -[place] of abdomen on 10/07/2023 showed no splenomegaly. There were findings of liver changes, which may be suggestive of early liver cirrhosis. Likely that the changes are related to alcohol abuse -CMP shows abnormal bilirubin at 1.5 mg/dL; other liver  enzymes are normal -educated patient that alcohol can suppress the bone marrow and cause liver problems causing low blood counts -educated patient that the liver may repair itself if he stops alcohol consumption at this time, but his liver issues may become irreversible if he continues to drink alcohol -vitamin B-12 levels are low at 200 pg/mL -vitamin B-1 and B-6 levels are normal -Hepatitis C testing negative -no clinical or lab suggestion of a bone marrow issue at this time -encouraged alcohol cessation. Discussed associated risk of seizures and negatively impacting overall health -recommend OTC B12, 1000-2000 mcgs daily,  to optimize nerve health, memory and bone marrow support  -recommend taking one capsule daily of vitamin B complex to optimize other B vitamins -recommend patient to consume plenty of fresh fruits and vegetables in the diet -continue to follow regularly with PCP -there may be a role to recheck B12 levels in 3-4 months  FOLLOW-UP: RTC with PCP  The total time spent in the appointment was 30 minutes* .  All of the patient's questions were answered with apparent satisfaction. The patient knows to call the clinic with any problems, questions or concerns.   Wyvonnia Lora MD MS AAHIVMS  [facility] Hematology/Oncology Physician [facility]  .*Total Encounter Time as defined by the Centers for Medicare and Medicaid Services includes, in addition to the face-to-face time of a patient visit (documented in the note above) non-face-to-face time: obtaining and reviewing outside history, ordering and reviewing medications, tests or procedures, care coordination (communications with other health care professionals or caregivers) and documentation in the medical record.    I,Mitra Faeizi,acting as a Neurosurgeon for Wyvonnia Lora, MD.,have documented all relevant documentation on the behalf of Wyvonnia Lora, MD,as directed by  Wyvonnia Lora, MD while in the presence of Wyvonnia Lora,  MD.  .I have reviewed the above documentation for accuracy and completeness, and I agree with the above. Johney Maine MD

## 2024-01-03 ENCOUNTER — Other Ambulatory Visit: Payer: Self-pay

## 2024-01-03 ENCOUNTER — Emergency Department (HOSPITAL_COMMUNITY)
Admission: EM | Admit: 2024-01-03 | Discharge: 2024-01-03 | Disposition: A | Discharge status: Home / Self Care | Payer: Medicare HMO | Attending: Emergency Medicine | Admitting: Emergency Medicine

## 2024-01-03 ENCOUNTER — Encounter (HOSPITAL_COMMUNITY): Payer: Self-pay

## 2024-01-03 DIAGNOSIS — B301 Conjunctivitis due to adenovirus: Secondary | ICD-10-CM | POA: Insufficient documentation

## 2024-01-03 DIAGNOSIS — Z20822 Contact with and (suspected) exposure to covid-19: Secondary | ICD-10-CM | POA: Diagnosis not present

## 2024-01-03 DIAGNOSIS — J069 Acute upper respiratory infection, unspecified: Secondary | ICD-10-CM | POA: Insufficient documentation

## 2024-01-03 DIAGNOSIS — R059 Cough, unspecified: Secondary | ICD-10-CM | POA: Diagnosis present

## 2024-01-03 LAB — CBC WITH DIFFERENTIAL/PLATELET
Abs Immature Granulocytes: 0.01 10*3/uL (ref 0.00–0.07)
Basophils Absolute: 0 10*3/uL (ref 0.0–0.1)
Basophils Relative: 1 %
Eosinophils Absolute: 0.1 10*3/uL (ref 0.0–0.5)
Eosinophils Relative: 2 %
HCT: 42.1 % (ref 39.0–52.0)
Hemoglobin: 14.3 g/dL (ref 13.0–17.0)
Immature Granulocytes: 0 %
Lymphocytes Relative: 39 %
Lymphs Abs: 2 10*3/uL (ref 0.7–4.0)
MCH: 31 pg (ref 26.0–34.0)
MCHC: 34 g/dL (ref 30.0–36.0)
MCV: 91.3 fL (ref 80.0–100.0)
Monocytes Absolute: 0.8 10*3/uL (ref 0.1–1.0)
Monocytes Relative: 14 %
Neutro Abs: 2.3 10*3/uL (ref 1.7–7.7)
Neutrophils Relative %: 44 %
Platelets: 241 10*3/uL (ref 150–400)
RBC: 4.61 MIL/uL (ref 4.22–5.81)
RDW: 12.9 % (ref 11.5–15.5)
WBC: 5.2 10*3/uL (ref 4.0–10.5)
nRBC: 0 % (ref 0.0–0.2)

## 2024-01-03 LAB — BASIC METABOLIC PANEL
Anion gap: 9 (ref 5–15)
BUN: 9 mg/dL (ref 8–23)
CO2: 22 mmol/L (ref 22–32)
Calcium: 9.2 mg/dL (ref 8.9–10.3)
Chloride: 111 mmol/L (ref 98–111)
Creatinine, Ser: 0.86 mg/dL (ref 0.61–1.24)
GFR, Estimated: >60 mL/min (ref >60)
Glucose, Bld: 110 mg/dL — ABNORMAL HIGH (ref 70–99)
Potassium: 3.7 mmol/L (ref 3.5–5.1)
Sodium: 142 mmol/L (ref 135–145)

## 2024-01-03 LAB — RESP PANEL BY RT-PCR (RSV, FLU A&B, COVID)  RVPGX2
Influenza A by PCR: NEGATIVE
Influenza B by PCR: NEGATIVE
Resp Syncytial Virus by PCR: NEGATIVE
SARS Coronavirus 2 by RT PCR: NEGATIVE

## 2024-01-03 NOTE — ED Provider Triage Note (Signed)
 Emergency Medicine Provider Triage Evaluation Note  Gerald Bradley , a 68 y.o. male  was evaluated in triage.  Pt complains of bilateral eye redness/pruritis, coughing, sneezing, rhinorrhea, chills, subjective fever for past two days.   Has taken dayquil and benadryl without relief. No OTC medication today  Review of Systems  Positive: bilateral eye redness/pruitis, coughing, sneezing, rhinorrhea, chills, subjective fever Negative: N/V/D  Physical Exam  BP (!) 161/88 (BP Location: Right Arm)   Pulse (!) 54   Temp 97.6 F (36.4 C)   Resp 16   Ht 5' 10 (1.778 m)   Wt 77.1 kg   SpO2 99%   BMI 24.39 kg/m  Gen:   Awake, no distress   Resp:  Normal effort  MSK:   Moves extremities without difficulty  Other:   Medical Decision Making  Medically screening exam initiated at 9:40 AM.  Appropriate orders placed.  Gerald Bradley was informed that the remainder of the evaluation will be completed by another provider, this initial triage assessment does not replace that evaluation, and the importance of remaining in the ED until their evaluation is complete.  Labs ordered   Gerald Bradley, Gerald Bradley 01/03/24 [phone]

## 2024-01-03 NOTE — ED Provider Notes (Signed)
 [place] EMERGENCY DEPARTMENT AT [facility] Provider Note   CSN: [id number] Arrival date & time: 01/03/24  9097     History  Chief Complaint  Patient presents with   Nasal Congestion   Cough    Gerald Bradley is a 68 y.o. male.  This is a 68 year old male who is here today for runny nose, cough, congestion, chills over the last few days.   Cough      Home Medications Prior to Admission medications   Medication Sig Start Date End Date Taking? Authorizing Provider  acetaminophen  (TYLENOL ) 500 MG tablet Take by mouth. 02/09/18   [provider]  allopurinol (ZYLOPRIM) 100 MG tablet Take 100 mg by mouth 2 (two) times daily.    [provider]  Buprenorphine HCl-Naloxone HCl 8-2 MG FILM Suboxone 8 mg-2 mg sublingual film  DISSOLVE 1 FILM UNDER TONGUE TWICE DAILY AS NEEDED    [provider]  clotrimazole -betamethasone  (LOTRISONE ) cream Apply to affected area 2 times daily prn 08/17/21   Lamptey, Aleene KIDD, MD  ergocalciferol (VITAMIN D2) 1.25 MG (50000 UT) capsule ergocalciferol (vitamin D2) 1,250 mcg (50,000 unit) capsule  TAKE 1 CAPSULE BY MOUTH WEEKLY    [provider]  esomeprazole (NEXIUM) 20 MG capsule Take by mouth. 12/17/17   [provider]  lidocaine  (LMX) 4 % cream Apply topically. 10/17/16   [provider]  losartan  (COZAAR ) 25 MG tablet Take 1 tablet (25 mg total) by mouth daily. 04/12/21   Hobart Powell BRAVO, MD  methocarbamol  (ROBAXIN ) 500 MG tablet Take 1 tablet (500 mg total) by mouth 3 (three) times daily as needed for muscle spasms. 09/26/22   Ragan, Michael, FNP  naloxone [facility]) nasal spray 4 mg/0.1 mL Narcan 4 mg/actuation nasal spray  USE 1 SPRAY AS DIRECTED IN EACH NOSTRIL    [provider]  naproxen  (NAPROSYN ) 375 MG tablet Take 1 tablet (375 mg total) by mouth 2 (two) times daily. 09/08/23   Lamptey, Aleene KIDD, MD  oxycodone  (OXY-IR) 5 MG capsule Take 5 mg by mouth every 4 (four)  hours as needed.    [provider]  polyethylene glycol (GOLYTELY) 236 g solution See directions from GI clinic. Mix with 1 gallon water. Drink half gallon the night before procedure and half gallon on morning of procedure 03/20/17   [provider]  rosuvastatin  (CRESTOR ) 10 MG tablet Take 1 tablet (10 mg total) by mouth daily. Please make overdue appt with Dr. Hobart before anymore refills. Thank you 1st attempt 11/26/21   Hobart Powell BRAVO, MD  trimethoprim -polymyxin b  (POLYTRIM ) ophthalmic solution Place 1 drop into the left eye every 4 (four) hours. Every 4 hours while awake for 7 days. 07/05/21   Graham, Laura E, PA-C  omeprazole (PRILOSEC) 20 MG capsule Take 40 mg by mouth daily.  08/29/20  [provider]      Allergies    Iohexol    Review of Systems   Review of Systems  Respiratory:  Positive for cough.     Physical Exam Updated Vital Signs BP (!) 161/88 (BP Location: Right Arm)   Pulse (!) 54   Temp 97.6 F (36.4 C)   Resp 16   Ht 5' 10 (1.778 m)   Wt 77.1 kg   SpO2 99%   BMI 24.39 kg/m  Physical Exam Vitals reviewed.  HENT:     Head: Normocephalic and atraumatic.     Nose: Congestion and rhinorrhea present.     Mouth/Throat:  Pharynx: No oropharyngeal exudate.  Eyes:     Pupils: Pupils are equal, round, and reactive to light.     Comments: Bilateral conjunctivitis  Pulmonary:     Effort: Pulmonary effort is normal.     Breath sounds: Normal breath sounds.  Abdominal:     General: Abdomen is flat.     Palpations: Abdomen is soft.  Musculoskeletal:        General: Normal range of motion.     Cervical back: Normal range of motion.  Neurological:     General: No focal deficit present.     Mental Status: He is alert.     ED Results / Procedures / Treatments   Labs (all labs ordered are listed, but only abnormal results are displayed) Labs Reviewed  BASIC METABOLIC PANEL - Abnormal; Notable for the following components:       Result Value   Glucose, Bld 110 (*)    All other components within normal limits  RESP PANEL BY RT-PCR (RSV, FLU A&B, COVID)  [id number]  CBC WITH DIFFERENTIAL/PLATELET    EKG None  Radiology No results found.  Procedures Procedures    Medications Ordered in ED Medications - No data to display  ED Course/ Medical Decision Making/ A&P                                 Medical Decision Making 68 year old male here today for URI symptoms, bilateral conjunctivitis.  Plan-patient with viral syndrome, viral conjunctivitis.  Runny nose, cough, rhinorrhea.  Likely adenovirus.  Discussed this with patient.  Return precautions discussed, he was agreeable with plan.  Reviewed the patient's blood work.  No anemia, normal renal function.           Final Clinical Impression(s) / ED Diagnoses Final diagnoses:  Viral upper respiratory tract infection  Conjunctivitis due to Adenovirus    Rx / DC Orders ED Discharge Orders     None         Mannie Fairy DASEN, DO 01/03/24 1138

## 2024-01-03 NOTE — ED Triage Notes (Signed)
 Pt arrived POV from home c/o runny nose, cough, nasal congestion and eyes burning x3 days.

## 2024-01-03 NOTE — Discharge Instructions (Addendum)
 While you are in the emergency room you had blood work done that was normal.  You likely have one of the many viruses that is going around, most likely a virus called adenovirus.  This can cause redness and irritation to your eyes.  I recommend using over-the-counter eyedrops.  Symptoms typically last 5 to 7 days.  You can also take 1000 mg of Tylenol  every 8 hours, 400 mg of ibuprofen  every 6 hours.

## 2024-02-16 ENCOUNTER — Encounter (HOSPITAL_COMMUNITY): Payer: Self-pay

## 2024-02-16 ENCOUNTER — Ambulatory Visit (HOSPITAL_COMMUNITY)
Admission: EM | Admit: 2024-02-16 | Discharge: 2024-02-16 | Disposition: A | Discharge status: Home / Self Care | Payer: Medicare HMO

## 2024-02-16 DIAGNOSIS — L720 Epidermal cyst: Secondary | ICD-10-CM

## 2024-02-16 MED ORDER — DEXAMETHASONE SODIUM PHOSPHATE 10 MG/ML IJ SOLN
10.0000 mg | Freq: Once | INTRAMUSCULAR | Status: AC
  Administered 2024-02-16: 10 mg via INTRAMUSCULAR

## 2024-02-16 MED ORDER — DOXYCYCLINE HYCLATE 100 MG PO CAPS: 100.0000 mg | ORAL_CAPSULE | Freq: Two times a day (BID) | ORAL | 0 refills | Status: AC

## 2024-02-16 MED ORDER — DEXAMETHASONE SODIUM PHOSPHATE 10 MG/ML IJ SOLN
INTRAMUSCULAR | Status: AC
  Filled 2024-02-16: qty 1

## 2024-02-16 NOTE — Discharge Instructions (Addendum)
 Area the right shoulder is a epidermal inclusion cyst.  This does not need incision and drainage today but in order to get rid of the area completely, it will need to be surgically excised by a surgeon.  We will treat this today with antibiotics and steroids to help with the pain and swelling.  We will treat with the following: Decadron injection given today. This is a steroid to help with inflammation and pain. Doxycycline 100 mg twice daily for 10 days. Take this with food.  Call [facility] to schedule an appointment for evaluation of epidermal inclusion cyst of the right shoulder Return to urgent care or PCP if symptoms worsen or fail to resolve.

## 2024-02-16 NOTE — ED Provider Notes (Signed)
 [facility]    CSN: [id number] Arrival date & time: 02/16/24  1245      History   Chief Complaint Chief Complaint  Patient presents with   Abscess    HPI Gerald Bradley is a 68 y.o. male.   68 year old male who presents to urgent care with complaints of a small mass on the right posterior shoulder that has gotten slightly bigger and tender over the last 2 months.  He reports 2 months ago it was a small area and was only mildly tender but over the last few days it has gotten more tender and more swollen.  He denies any history of this in the past.  It has not drained.  He denies fevers or chills.   Abscess Associated symptoms: no fever and no vomiting     Past Medical History:  Diagnosis Date   Alcohol dependence (HCC)    Arthritis    Bradycardia    Cannabis abuse    Cardiomyopathy (HCC)    Cataract    Fatty liver    History of pneumothorax    History of rectal bleeding    Homelessness    Hypercholesterolemia    Nicotine dependence    Osteoarthritis    Overweight    Rectal bleeding    Hx of   SOB (shortness of breath)    Toe pain, right    Trapezius muscle spasm     Patient Active Problem List   Diagnosis Date Noted   Heavy alcohol use 08/29/2020   Gout 08/29/2020   Cardiomyopathy (HCC) 04/19/2012   HYPERCHOLESTEROLEMIA 08/23/2009   BRADYCARDIA 07/06/2009   ARTHRITIS 07/06/2009   SHORTNESS OF BREATH 07/06/2009   RECTAL BLEEDING, HX OF 07/06/2009    Past Surgical History:  Procedure Laterality Date   LUNG SURGERY     resection for spontaneous pneumothorax skin graft   SKIN SURGERY         Home Medications    Prior to Admission medications   Medication Sig Start Date End Date Taking? Authorizing Provider  baclofen (LIORESAL) 10 MG tablet Take 10 mg by mouth 2 (two) times daily.   Yes [provider]  doxycycline (VIBRAMYCIN) 100 MG capsule Take 1 capsule (100 mg total) by mouth 2 (two) times daily. 02/16/24  Yes Andersen Mckiver,  Rose Hegner A, PA-C  topiramate (TOPAMAX) 25 MG capsule Take 25 mg by mouth daily.   Yes [provider]  acetaminophen (TYLENOL) 500 MG tablet Take by mouth. 02/09/18   [provider]  allopurinol (ZYLOPRIM) 100 MG tablet Take 100 mg by mouth 2 (two) times daily.    [provider]  Buprenorphine HCl-Naloxone HCl 8-2 MG FILM Suboxone 8 mg-2 mg sublingual film  DISSOLVE 1 FILM UNDER TONGUE TWICE DAILY AS NEEDED    [provider]  clotrimazole-betamethasone (LOTRISONE) cream Apply to affected area 2 times daily prn 08/17/21   Lamptey, Britta Mccreedy, MD  ergocalciferol (VITAMIN D2) 1.25 MG (50000 UT) capsule ergocalciferol (vitamin D2) 1,250 mcg (50,000 unit) capsule  TAKE 1 CAPSULE BY MOUTH WEEKLY    [provider]  esomeprazole (NEXIUM) 20 MG capsule Take by mouth. 12/17/17   [provider]  lidocaine (LMX) 4 % cream Apply topically. 10/17/16   [provider]  losartan (COZAAR) 25 MG tablet Take 1 tablet (25 mg total) by mouth daily. 04/12/21   Meriam Sprague, MD  methocarbamol (ROBAXIN) 500 MG tablet Take 1 tablet (500 mg total) by mouth 3 (three) times daily as  needed for muscle spasms. 09/26/22   Trevor Iha, FNP  naloxone [facility]) nasal spray 4 mg/0.1 mL Narcan 4 mg/actuation nasal spray  USE 1 SPRAY AS DIRECTED IN EACH NOSTRIL    [provider]  naproxen (NAPROSYN) 375 MG tablet Take 1 tablet (375 mg total) by mouth 2 (two) times daily. 09/08/23   Lamptey, Britta Mccreedy, MD  oxycodone (OXY-IR) 5 MG capsule Take 5 mg by mouth every 4 (four) hours as needed.    [provider]  polyethylene glycol (GOLYTELY) 236 g solution See directions from GI clinic. Mix with 1 gallon water. Drink half gallon the night before procedure and half gallon on morning of procedure 03/20/17   [provider]  rosuvastatin (CRESTOR) 10 MG tablet Take 1 tablet (10 mg total) by mouth daily. Please make overdue appt with Dr. Shari Prows  before anymore refills. Thank you 1st attempt 11/26/21   Meriam Sprague, MD  trimethoprim-polymyxin b Joaquim Lai) ophthalmic solution Place 1 drop into the left eye every 4 (four) hours. Every 4 hours while awake for 7 days. 07/05/21   Rhys Martini, PA-C  omeprazole (PRILOSEC) 20 MG capsule Take 40 mg by mouth daily.  08/29/20  [provider]    Family History Family History  Problem Relation Age of Onset   Healthy Mother     Social History Social History   Tobacco Use   Smoking status: Former    Types: Cigarettes   Smokeless tobacco: Never  Vaping Use   Vaping status: Never Used  Substance Use Topics   Alcohol use: Yes    Alcohol/week: 6.0 standard drinks of alcohol    Types: 6 Cans of beer per week   Drug use: Not Currently     Allergies   Iohexol   Review of Systems Review of Systems  Constitutional:  Negative for chills and fever.  HENT:  Negative for ear pain and sore throat.   Eyes:  Negative for pain and visual disturbance.  Respiratory:  Negative for cough and shortness of breath.   Cardiovascular:  Negative for chest pain and palpitations.  Gastrointestinal:  Negative for abdominal pain and vomiting.  Genitourinary:  Negative for dysuria and hematuria.  Musculoskeletal:  Negative for arthralgias and back pain.  Skin:  Negative for color change and rash.       Right shoulder tenderness from possible abscess  Neurological:  Negative for seizures and syncope.  All other systems reviewed and are negative.    Physical Exam Triage Vital Signs ED Triage Vitals [02/16/24 1552]  Encounter Vitals Group     BP (!) 155/76     Systolic BP Percentile      Diastolic BP Percentile      Pulse Rate (!) 59     Resp 16     Temp 97.8 F (36.6 C)     Temp Source Oral     SpO2 96 %     Weight      Height      Head Circumference      Peak Flow      Pain Score 10     Pain Loc      Pain Education      Exclude from Growth Chart    No data  found.  Updated Vital Signs BP (!) 155/76 (BP Location: Left Arm)   Pulse (!) 59   Temp 97.8 F (36.6 C) (Oral)   Resp 16   SpO2 96%   Visual Acuity Right  Eye Distance:   Left Eye Distance:   Bilateral Distance:    Right Eye Near:   Left Eye Near:    Bilateral Near:     Physical Exam Vitals and nursing note reviewed.  Constitutional:      General: He is not in acute distress.    Appearance: He is well-developed.  HENT:     Head: Normocephalic and atraumatic.  Eyes:     Conjunctiva/sclera: Conjunctivae normal.  Cardiovascular:     Rate and Rhythm: Normal rate and regular rhythm.     Heart sounds: No murmur heard. Pulmonary:     Effort: Pulmonary effort is normal. No respiratory distress.     Breath sounds: Normal breath sounds.  Abdominal:     Palpations: Abdomen is soft.     Tenderness: There is no abdominal tenderness.  Musculoskeletal:        General: No swelling.     Cervical back: Neck supple.  Skin:    General: Skin is warm and dry.     Capillary Refill: Capillary refill takes less than 2 seconds.       Neurological:     Mental Status: He is alert.  Psychiatric:        Mood and Affect: Mood normal.      UC Treatments / Results  Labs (all labs ordered are listed, but only abnormal results are displayed) Labs Reviewed - No data to display  EKG   Radiology No results found.  Procedures Procedures (including critical care time)  Medications Ordered in UC Medications  dexamethasone (DECADRON) injection 10 mg (has no administration in time range)    Initial Impression / Assessment and Plan / UC Course  I have reviewed the triage vital signs and the nursing notes.  Pertinent labs & imaging results that were available during my care of the patient were reviewed by me and considered in my medical decision making (see chart for details).     Epidermal inclusion cyst  Area the right shoulder is a epidermal inclusion cyst.  This does not need  incision and drainage today but in order to get rid of the area completely, it will need to be surgically excised by a surgeon.  We will treat this today with antibiotics and steroids to help with the pain and swelling.  We will treat with the following: Decadron injection given today. This is a steroid to help with inflammation and pain. Doxycycline 100 mg twice daily for 10 days. Take this with food.  Call [facility] to schedule an appointment for evaluation of epidermal inclusion cyst of the right shoulder Return to urgent care or PCP if symptoms worsen or fail to resolve.     Final Clinical Impressions(s) / UC Diagnoses   Final diagnoses:  Epidermal inclusion cyst     Discharge Instructions      Area the right shoulder is a epidermal inclusion cyst.  This does not need incision and drainage today but in order to get rid of the area completely, it will need to be surgically excised by a surgeon.  We will treat this today with antibiotics and steroids to help with the pain and swelling.  We will treat with the following: Decadron injection given today. This is a steroid to help with inflammation and pain. Doxycycline 100 mg twice daily for 10 days. Take this with food.  Call [facility] to schedule an appointment for evaluation of epidermal inclusion cyst of the right shoulder Return to  urgent care or PCP if symptoms worsen or fail to resolve.     ED Prescriptions     Medication Sig Dispense Auth. Provider   doxycycline (VIBRAMYCIN) 100 MG capsule Take 1 capsule (100 mg total) by mouth 2 (two) times daily. 20 capsule Landis Martins, [place]      PDMP not reviewed this encounter.   Landis Martins, [place] 02/16/24 1615

## 2024-02-16 NOTE — ED Triage Notes (Signed)
 Patient c/o an abscess to the right shoulder area x 2 months. Patient states the area is painful now.

## 2024-02-25 ENCOUNTER — Ambulatory Visit: Payer: Self-pay | Admitting: Surgery

## 2024-02-25 NOTE — H&P (View-Only) (Signed)
 Gerald Bradley [id number]   Referring Provider:  Self   Subjective   Chief Complaint: New Consultation     History of Present Illness:    68 year old male with history of alcohol dependence, arthritis, cannabis use, cardiomyopathy, fatty liver, homelessness, hypercholesterolemia, nicotine dependence, osteoarthritis, and prior lung resection for spontaneous pneumothorax (?) who was referred from the urgent care office where he presented 9 days ago with a small, tender mass on the right posterior shoulder which has slightly enlarged over the last few months.  No previous drainage or procedures in this area.  He was diagnosed with an epidermal inclusion cyst and referred here for possible excision   Review of Systems: A complete review of systems was obtained from the patient.  I have reviewed this information and discussed as appropriate with the patient.  See HPI as well for other ROS.   Medical History: History reviewed. No pertinent past medical history.  There is no problem list on file for this patient.   Past Surgical History:  Procedure Laterality Date   Lung surgery     SKin surgery       Allergies  Allergen Reactions   Iohexol Hives    Patient didn't receive benadryl hives subsided.    Current Outpatient Medications on File Prior to Visit  Medication Sig Dispense Refill   allopurinoL (ZYLOPRIM) 100 MG tablet allopurinol 100 mg tablet TAKE 1 TABLET BY MOUTH TWICE DAILY     baclofen (LIORESAL) 10 MG tablet baclofen 10 mg tablet TAKE 1 TABLET BY MOUTH TWICE DAILY AS NEEDED     buprenorphine-naloxone (SUBOXONE) 8-2 mg SL film Suboxone 8 mg-2 mg sublingual film  DISSOLVE 1 FILM UNDER TONGUE TWICE DAILY AS NEEDED     clotrimazole-betamethasone (LOTRISONE) 1-0.05 % cream Apply to affected area 2 times daily prn     doxycycline (VIBRAMYCIN) 100 MG capsule Take 100 mg by mouth 2 (two) times daily     ergocalciferol, vitamin D2, 1,250 mcg (50,000 unit) capsule ergocalciferol  (vitamin D2) 1,250 mcg (50,000 unit) capsule  TAKE 1 CAPSULE BY MOUTH WEEKLY     fluticasone propionate (FLONASE) 50 mcg/actuation nasal spray      levocetirizine (XYZAL) 5 MG tablet      losartan (COZAAR) 25 MG tablet Take 1 tablet by mouth once daily     methocarbamoL (ROBAXIN) 500 MG tablet Take 500 mg by mouth     naloxone (NARCAN) 4 mg/actuation nasal spray Narcan 4 mg/actuation nasal spray  USE 1 SPRAY AS DIRECTED IN EACH NOSTRIL     naproxen (NAPROSYN) 375 MG tablet Take 375 mg by mouth     oxyCODONE 5 mg immediate release capsule Take 5 mg by mouth     rosuvastatin (CRESTOR) 10 MG tablet Take 10 mg by mouth     topiramate (TOPAMAX SPRINKLE) 25 MG capsule Take 25 mg by mouth once daily     trimethoprim-polymyxin b (POLYTRIM) ophthalmic solution Apply 1 drop to eye     No current facility-administered medications on file prior to visit.    History reviewed. No pertinent family history.   Social History   Tobacco Use  Smoking Status Never  Smokeless Tobacco Never     Social History   Socioeconomic History   Marital status: Single  Tobacco Use   Smoking status: Never   Smokeless tobacco: Never  Substance and Sexual Activity   Alcohol use: Never   Drug use: Never   Social Drivers of Health    Received from [facility]   Social Network  Housing Stability: Unknown (02/25/2024)   Housing Stability Vital Sign    Homeless in the Last Year: No    Objective:    Vitals:   02/25/24 1541 02/25/24 1546  BP: (!) 163/90   Pulse: 74   Temp: 36.8 C (98.3 F)   SpO2: 96%   Weight: 82 kg (180 lb 12.8 oz)   Height: 176.5 cm (5' 9.5")   PainSc:  0-No pain  PainLoc:  Back    Body mass index is 26.32 kg/m.  Gen: A&Ox3, no distress  Unlabored respirations Approximately 3 cm fluctuant sebaceous cyst on the right posterior shoulder with central pore  Assessment and Plan:  Diagnoses and all orders for this visit:  Sebaceous cyst    Discussed option of excision.   We reviewed the procedure, risks including bleeding, infection, pain, scarring, injury to underlying structures, wound healing or undesired cosmetic result, recurrence of the lesion.  Questions welcomed and answered.  Patient advised that if it becomes more swollen or painful over the next week or 2 or in between now and surgery to call us and we can get him into the urgent office for I&D.  Deetra Booton Carlye Grippe, MD

## 2024-02-25 NOTE — H&P (Signed)
 Earl Gala [id number]   Referring Provider:  Self   Subjective   Chief Complaint: New Consultation     History of Present Illness:    68 year old male with history of alcohol dependence, arthritis, cannabis use, cardiomyopathy, fatty liver, homelessness, hypercholesterolemia, nicotine dependence, osteoarthritis, and prior lung resection for spontaneous pneumothorax (?) who was referred from the urgent care office where he presented 9 days ago with a small, tender mass on the right posterior shoulder which has slightly enlarged over the last few months.  No previous drainage or procedures in this area.  He was diagnosed with an epidermal inclusion cyst and referred here for possible excision   Review of Systems: A complete review of systems was obtained from the patient.  I have reviewed this information and discussed as appropriate with the patient.  See HPI as well for other ROS.   Medical History: History reviewed. No pertinent past medical history.  There is no problem list on file for this patient.   Past Surgical History:  Procedure Laterality Date   Lung surgery     SKin surgery       Allergies  Allergen Reactions   Iohexol Hives    Patient didn't receive benadryl hives subsided.    Current Outpatient Medications on File Prior to Visit  Medication Sig Dispense Refill   allopurinoL (ZYLOPRIM) 100 MG tablet allopurinol 100 mg tablet TAKE 1 TABLET BY MOUTH TWICE DAILY     baclofen (LIORESAL) 10 MG tablet baclofen 10 mg tablet TAKE 1 TABLET BY MOUTH TWICE DAILY AS NEEDED     buprenorphine-naloxone (SUBOXONE) 8-2 mg SL film Suboxone 8 mg-2 mg sublingual film  DISSOLVE 1 FILM UNDER TONGUE TWICE DAILY AS NEEDED     clotrimazole-betamethasone (LOTRISONE) 1-0.05 % cream Apply to affected area 2 times daily prn     doxycycline (VIBRAMYCIN) 100 MG capsule Take 100 mg by mouth 2 (two) times daily     ergocalciferol, vitamin D2, 1,250 mcg (50,000 unit) capsule ergocalciferol  (vitamin D2) 1,250 mcg (50,000 unit) capsule  TAKE 1 CAPSULE BY MOUTH WEEKLY     fluticasone propionate (FLONASE) 50 mcg/actuation nasal spray      levocetirizine (XYZAL) 5 MG tablet      losartan (COZAAR) 25 MG tablet Take 1 tablet by mouth once daily     methocarbamoL (ROBAXIN) 500 MG tablet Take 500 mg by mouth     naloxone (NARCAN) 4 mg/actuation nasal spray Narcan 4 mg/actuation nasal spray  USE 1 SPRAY AS DIRECTED IN EACH NOSTRIL     naproxen (NAPROSYN) 375 MG tablet Take 375 mg by mouth     oxyCODONE 5 mg immediate release capsule Take 5 mg by mouth     rosuvastatin (CRESTOR) 10 MG tablet Take 10 mg by mouth     topiramate (TOPAMAX SPRINKLE) 25 MG capsule Take 25 mg by mouth once daily     trimethoprim-polymyxin b (POLYTRIM) ophthalmic solution Apply 1 drop to eye     No current facility-administered medications on file prior to visit.    History reviewed. No pertinent family history.   Social History   Tobacco Use  Smoking Status Never  Smokeless Tobacco Never     Social History   Socioeconomic History   Marital status: Single  Tobacco Use   Smoking status: Never   Smokeless tobacco: Never  Substance and Sexual Activity   Alcohol use: Never   Drug use: Never   Social Drivers of Health    Received from [facility]   Social Network  Housing Stability: Unknown (02/25/2024)   Housing Stability Vital Sign    Homeless in the Last Year: No    Objective:    Vitals:   02/25/24 1541 02/25/24 1546  BP: (!) 163/90   Pulse: 74   Temp: 36.8 C (98.3 F)   SpO2: 96%   Weight: 82 kg (180 lb 12.8 oz)   Height: 176.5 cm (5' 9.5")   PainSc:  0-No pain  PainLoc:  Back    Body mass index is 26.32 kg/m.  Gen: A&Ox3, no distress  Unlabored respirations Approximately 3 cm fluctuant sebaceous cyst on the right posterior shoulder with central pore  Assessment and Plan:  Diagnoses and all orders for this visit:  Sebaceous cyst    Discussed option of excision.   We reviewed the procedure, risks including bleeding, infection, pain, scarring, injury to underlying structures, wound healing or undesired cosmetic result, recurrence of the lesion.  Questions welcomed and answered.  Patient advised that if it becomes more swollen or painful over the next week or 2 or in between now and surgery to call us and we can get him into the urgent office for I&D.  Deetra Booton Carlye Grippe, MD

## 2024-03-03 NOTE — Progress Notes (Signed)
 COVID Vaccine received:  []  No [x]  Yes Date of any COVID positive Test in last 90 days:  None  PCP - Cain Saupe, MD  Cardiologist - saw Laurance Flatten, MD 10-10-2020   Chest x-ray -05-30-2022  EKG -  06-03-22    will repeat 03-04-2024 Stress Test -  ECHO - 10-31-2020  Epic Cardiac Cath -   PCR screen: []  Ordered & Completed []   No Order but Needs PROFEND     [x]   N/A for this surgery  Surgery Plan:  [x]  Ambulatory   []  Outpatient in bed  []  Admit Anesthesia:    []  General  []  Spinal  []   Choice [x]   MAC  Pacemaker / ICD device [x]  No []  Yes   Spinal Cord Stimulator:[x]  No []  Yes       History of Sleep Apnea? [x]  No []  Yes   CPAP used?- [x]  No []  Yes    Does the patient monitor blood sugar?   [x]  N/A   []  No []  Yes  Patient has: [x]  NO Hx DM   []  Pre-DM   []  DM1  []   DM2  Blood Thinner / Instructions:none Aspirin Instructions:  none  ERAS Protocol Ordered: [x]  No  []  Yes Patient is to be NPO after: midnight prior  Dental hx: [x]  Dentures:   Full Set of dentures []  N/A      []  Bridge or Partial:                   []  Loose or Damaged teeth:   Comments:   Activity level: Patient is able to climb a flight of stairs without difficulty; [x]  No CP  [x]  No SOB  Patient can perform ADLs without assistance.   Anesthesia review: Bradycardia, CM , HTN, Gout, ETOH / THC abuse, fatty liver, s/p partial lung resection for spontaneous pneumothorax.   Patient denies shortness of breath, fever, cough and chest pain at PAT appointment.  Patient verbalized understanding and agreement to the Pre-Surgical Instructions that were given to them at this PAT appointment. Patient was also educated of the need to review these PAT instructions again prior to his surgery.I reviewed the appropriate phone numbers to call if they have any and questions or concerns.

## 2024-03-03 NOTE — Patient Instructions (Signed)
 SURGICAL WAITING ROOM VISITATION Patients having surgery or a procedure may have no more than 2 support people in the waiting area - these visitors may rotate in the visitor waiting room.   Due to an increase in RSV and influenza rates and associated hospitalizations, children ages 97 and under may not visit patients in [facility] hospitals. If the patient needs to stay at the hospital during part of their recovery, the visitor guidelines for inpatient rooms apply.  PRE-OP VISITATION  Pre-op nurse will coordinate an appropriate time for 1 support person to accompany the patient in pre-op.  This support person may not rotate.  This visitor will be contacted when the time is appropriate for the visitor to come back in the pre-op area.  Please refer to the [facility] website for the visitor guidelines for Inpatients (after your surgery is over and you are in a regular room).  You are not required to quarantine at this time prior to your surgery. However, you must do this: Hand Hygiene often Do NOT share personal items Notify your provider if you are in close contact with someone who has COVID or you develop fever 100.4 or greater, new onset of sneezing, cough, sore throat, shortness of breath or body aches.  If you test positive for Covid or have been in contact with anyone that has tested positive in the last 10 days please notify you surgeon.    Your procedure is scheduled on:  [facility]  March 10, 2024  Report to [facility] Main Entrance: [facility]sen entrance where the [employer] is available.   Report to admitting at: 06:15    AM  Call this number if you have any questions or problems the morning of surgery [phone]  DO NOT EAT OR DRINK ANYTHING AFTER MIDNIGHT THE NIGHT PRIOR TO YOUR SURGERY / PROCEDURE.   FOLLOW  ANY ADDITIONAL PRE OP INSTRUCTIONS YOU RECEIVED FROM YOUR SURGEON'S OFFICE!!!   Oral Hygiene is also important to reduce your risk of infection.         Remember - BRUSH YOUR TEETH THE MORNING OF SURGERY WITH YOUR REGULAR TOOTHPASTE  Do NOT smoke after Midnight the night before surgery.  STOP TAKING all Vitamins, Herbs and supplements 1 week before your surgery.   Take ONLY these medicines the morning of surgery with A SIP OF WATER: allopurinol, Topiramate (Topamax), You may take EITHER Tylenol OR oxycodone if needed for pain.                   You may not have any metal on your body including jewelry, and body piercing  Do not wear  lotions, powders, cologne, or deodorant  Men may shave face and neck.  Contacts, Hearing Aids, dentures or bridgework may not be worn into surgery. DENTURES WILL BE REMOVED PRIOR TO SURGERY PLEASE DO NOT APPLY "Poly grip" OR ADHESIVES!!!  Patients discharged on the day of surgery will not be allowed to drive home.  Someone NEEDS to stay with you for the first 24 hours after anesthesia.  Do not bring your home medications to the hospital. The Pharmacy will dispense medications listed on your medication list to you during your admission in the Hospital.  Please read over the following fact sheets you were given: IF YOU HAVE QUESTIONS ABOUT YOUR PRE-OP INSTRUCTIONS, PLEASE CALL [phone].   [facility] - Preparing for Surgery Before surgery, you can play an important role.  Because skin is not sterile, your skin needs to  be as free of germs as possible.  You can reduce the number of germs on your skin by washing with CHG (chlorahexidine gluconate) soap before surgery.  CHG is an antiseptic cleaner which kills germs and bonds with the skin to continue killing germs even after washing. Please DO NOT use if you have an allergy to CHG or antibacterial soaps.  If your skin becomes reddened/irritated stop using the CHG and inform your nurse when you arrive at Short Stay. Do not shave (including legs and underarms) for at least 48 hours prior to the first CHG shower.  You may shave your face/neck.  Please follow  these instructions carefully:  1.  Shower with CHG Soap the night before surgery and the  morning of surgery.  2.  If you choose to wash your hair, wash your hair first as usual with your normal  shampoo.  3.  After you shampoo, rinse your hair and body thoroughly to remove the shampoo.                             4.  Use CHG as you would any other liquid soap.  You can apply chg directly to the skin and wash.  Gently with a scrungie or clean washcloth.  5.  Apply the CHG Soap to your body ONLY FROM THE NECK DOWN.   Do not use on face/ open                           Wound or open sores. Avoid contact with eyes, ears mouth and genitals (private parts).                       Wash face,  Genitals (private parts) with your normal soap.             6.  Wash thoroughly, paying special attention to the area where your  surgery  will be performed.  7.  Thoroughly rinse your body with warm water from the neck down.  8.  DO NOT shower/wash with your normal soap after using and rinsing off the CHG Soap.            9.  Pat yourself dry with a clean towel.            10.  Wear clean pajamas.            11.  Place clean sheets on your bed the night of your first shower and do not  sleep with pets.  ON THE DAY OF SURGERY : Do not apply any lotions/deodorants the morning of surgery.  Please wear clean clothes to the hospital/surgery center.    FAILURE TO FOLLOW THESE INSTRUCTIONS MAY RESULT IN THE CANCELLATION OF YOUR SURGERY  PATIENT SIGNATURE_________________________________  NURSE SIGNATURE__________________________________  ________________________________________________________________________

## 2024-03-04 ENCOUNTER — Encounter (HOSPITAL_COMMUNITY)
Admission: RE | Admit: 2024-03-04 | Discharge: 2024-03-04 | Disposition: A | Discharge status: Home / Self Care | Payer: Medicare HMO | Source: Ambulatory Visit | Attending: Surgery | Admitting: Surgery

## 2024-03-04 ENCOUNTER — Encounter (HOSPITAL_COMMUNITY): Payer: Self-pay

## 2024-03-04 ENCOUNTER — Other Ambulatory Visit: Payer: Self-pay

## 2024-03-04 VITALS — BP 105/74 | HR 64 | Temp 98.1°F | Resp 16 | Ht 70.5 in | Wt 172.0 lb

## 2024-03-04 DIAGNOSIS — I5020 Unspecified systolic (congestive) heart failure: Secondary | ICD-10-CM | POA: Insufficient documentation

## 2024-03-04 DIAGNOSIS — F1090 Alcohol use, unspecified, uncomplicated: Secondary | ICD-10-CM | POA: Diagnosis not present

## 2024-03-04 DIAGNOSIS — L723 Sebaceous cyst: Secondary | ICD-10-CM | POA: Insufficient documentation

## 2024-03-04 DIAGNOSIS — Z01818 Encounter for other preprocedural examination: Secondary | ICD-10-CM | POA: Diagnosis not present

## 2024-03-04 DIAGNOSIS — Z87891 Personal history of nicotine dependence: Secondary | ICD-10-CM | POA: Diagnosis not present

## 2024-03-04 DIAGNOSIS — K760 Fatty (change of) liver, not elsewhere classified: Secondary | ICD-10-CM | POA: Insufficient documentation

## 2024-03-04 DIAGNOSIS — I10 Essential (primary) hypertension: Secondary | ICD-10-CM

## 2024-03-04 DIAGNOSIS — I110 Hypertensive heart disease with heart failure: Secondary | ICD-10-CM | POA: Diagnosis not present

## 2024-03-04 HISTORY — DX: Essential (primary) hypertension: I10

## 2024-03-04 HISTORY — DX: Heart failure, unspecified: I50.9

## 2024-03-04 HISTORY — DX: Cardiac arrhythmia, unspecified: I49.9

## 2024-03-04 LAB — COMPREHENSIVE METABOLIC PANEL
ALT: 28 U/L (ref 0–44)
AST: 39 U/L (ref 15–41)
Albumin: 3.6 g/dL (ref 3.5–5.0)
Alkaline Phosphatase: 35 U/L — ABNORMAL LOW (ref 38–126)
Anion gap: 9 (ref 5–15)
BUN: 16 mg/dL (ref 8–23)
CO2: 22 mmol/L (ref 22–32)
Calcium: 9 mg/dL (ref 8.9–10.3)
Chloride: 107 mmol/L (ref 98–111)
Creatinine, Ser: 0.91 mg/dL (ref 0.61–1.24)
GFR, Estimated: >60 mL/min (ref >60)
Glucose, Bld: 103 mg/dL — ABNORMAL HIGH (ref 70–99)
Potassium: 4 mmol/L (ref 3.5–5.1)
Sodium: 138 mmol/L (ref 135–145)
Total Bilirubin: 1.1 mg/dL (ref 0.0–1.2)
Total Protein: 7.5 g/dL (ref 6.5–8.1)

## 2024-03-04 LAB — CBC
HCT: 48.7 % (ref 39.0–52.0)
Hemoglobin: 15.6 g/dL (ref 13.0–17.0)
MCH: 30.4 pg (ref 26.0–34.0)
MCHC: 32 g/dL (ref 30.0–36.0)
MCV: 94.7 fL (ref 80.0–100.0)
Platelets: 210 10*3/uL (ref 150–400)
RBC: 5.14 MIL/uL (ref 4.22–5.81)
RDW: 13.1 % (ref 11.5–15.5)
WBC: 5 10*3/uL (ref 4.0–10.5)
nRBC: 0 % (ref 0.0–0.2)

## 2024-03-08 NOTE — Progress Notes (Signed)
 Anesthesia Chart Review   Case: [id number] Date/Time: 03/10/24 0815   Procedure: excision of sebaceous cyst right shoulder (Right)   Anesthesia type: Monitor Anesthesia Care   Pre-op diagnosis: sebaceous cyst   Location: [facility]OM 04 / [facility]   Surgeons: Berna Bue, MD       DISCUSSION:68 y.o. former smoker with h/o substance abuse, HTN, CHF, s/p partial lung resection for spontaneous pneumothorax, sebaceous cyst scheduled for above procedure 03/10/2024 with Dr. Phylliss Blakes.   CHF with reduced EF 45-50%.  Repeat Echo 2021 with improved EF of 50-55%.   Pt reports to PAT nurse he can climb a flight of stairs without sx.   VS: BP 105/74 Comment: right arm sitting  Pulse 64   Temp 36.7 C (Oral)   Resp 16   Ht 5' 10.5" (1.791 m)   Wt 78 kg   SpO2 98%   BMI 24.33 kg/m   PROVIDERS: Fulp, Cammie, MD is PCP    LABS: Labs reviewed: Acceptable for surgery. (all labs ordered are listed, but only abnormal results are displayed)  Labs Reviewed  COMPREHENSIVE METABOLIC PANEL - Abnormal; Notable for the following components:      Result Value   Glucose, Bld 103 (*)    Alkaline Phosphatase 35 (*)    All other components within normal limits  CBC     IMAGES:   EKG:   CV: Echo 10/31/2020  1. Left ventricular ejection fraction, by estimation, is 50 to 55%. The  left ventricle has low normal function. The left ventricle has no regional  wall motion abnormalities. Left ventricular diastolic parameters are  consistent with Grade II diastolic  dysfunction (pseudonormalization). The average left ventricular global  longitudinal strain is -21.3 %. The global longitudinal strain is normal.   2. Right ventricular systolic function is normal. The right ventricular  size is normal. There is normal pulmonary artery systolic pressure.   3. Left atrial size was moderately dilated.   4. The mitral valve is normal in structure. Trivial mitral valve  regurgitation. No evidence of  mitral stenosis.   5. The aortic valve is tricuspid. Aortic valve regurgitation is not  visualized. No aortic stenosis is present.   6. The inferior vena cava is normal in size with greater than 50%  respiratory variability, suggesting right atrial pressure of 3 mmHg.  Past Medical History:  Diagnosis Date   Alcohol dependence (HCC)    Arthritis    Bradycardia    Cannabis abuse    Cardiomyopathy (HCC)    Cataract    CHF (congestive heart failure) (HCC)    Dysrhythmia    bradycardia   Fatty liver    History of pneumothorax    History of rectal bleeding    Homelessness    Hypercholesterolemia    Hypertension    Nicotine dependence    Osteoarthritis    Overweight    Rectal bleeding    Hx of   SOB (shortness of breath)    Toe pain, right    Trapezius muscle spasm     Past Surgical History:  Procedure Laterality Date   LUNG SURGERY     resection for spontaneous pneumothorax skin graft   SHOULDER SURGERY Right    rotator cuff repair   SKIN SURGERY     right 5th finger    MEDICATIONS:  acetaminophen (TYLENOL) 500 MG tablet   allopurinol (ZYLOPRIM) 100 MG tablet   baclofen (LIORESAL) 10 MG tablet   doxycycline (VIBRAMYCIN) 100 MG  capsule   losartan (COZAAR) 25 MG tablet   naloxone (NARCAN) nasal spray 4 mg/0.1 mL   naproxen (NAPROSYN) 375 MG tablet   oxyCODONE HCl 7.5 MG TABS   rosuvastatin (CRESTOR) 10 MG tablet   topiramate (TOPAMAX) 25 MG capsule   No current facility-administered medications for this encounter.    Jodell Cipro Ward, PA-C [facility] Pre-Surgical Testing [phone]

## 2024-03-09 NOTE — Anesthesia Preprocedure Evaluation (Signed)
 Anesthesia Evaluation  Patient identified by MRN, date of birth, ID band Patient awake    Reviewed: Allergy & Precautions, NPO status , Patient's Chart, lab work & pertinent test results  History of Anesthesia Complications Negative for: history of anesthetic complications  Airway Mallampati: II  TM Distance: >3 FB Neck ROM: Full    Dental  (+) Edentulous Upper, Edentulous Lower   Pulmonary former smoker   Pulmonary exam normal        Cardiovascular hypertension, Pt. on medications +CHF  Normal cardiovascular exam     Neuro/Psych negative neurological ROS  negative psych ROS   GI/Hepatic negative GI ROS,,,(+)     substance abuse  alcohol use and marijuana use  Endo/Other  negative endocrine ROS    Renal/GU negative Renal ROS     Musculoskeletal  (+) Arthritis ,    Abdominal   Peds  Hematology negative hematology ROS (+)   Anesthesia Other Findings   Reproductive/Obstetrics                             Anesthesia Physical Anesthesia Plan  ASA: 3  Anesthesia Plan: MAC   Post-op Pain Management: Minimal or no pain anticipated   Induction: Intravenous  PONV Risk Score and Plan: 1 and Treatment may vary due to age or medical condition and Propofol infusion  Airway Management Planned: Natural Airway and Simple Face Mask  Additional Equipment: None  Intra-op Plan:   Post-operative Plan:   Informed Consent: I have reviewed the patients History and Physical, chart, labs and discussed the procedure including the risks, benefits and alternatives for the proposed anesthesia with the patient or authorized representative who has indicated his/her understanding and acceptance.       Plan Discussed with: CRNA and Anesthesiologist  Anesthesia Plan Comments:        Anesthesia Quick Evaluation

## 2024-03-10 ENCOUNTER — Ambulatory Visit (HOSPITAL_COMMUNITY): Payer: Self-pay | Admitting: Physician Assistant

## 2024-03-10 ENCOUNTER — Ambulatory Visit (HOSPITAL_BASED_OUTPATIENT_CLINIC_OR_DEPARTMENT_OTHER): Payer: Self-pay | Admitting: Anesthesiology

## 2024-03-10 ENCOUNTER — Encounter (HOSPITAL_COMMUNITY)
Admission: RE | Disposition: A | Discharge status: Home / Self Care | Payer: Self-pay | Source: Ambulatory Visit | Attending: Surgery

## 2024-03-10 ENCOUNTER — Other Ambulatory Visit: Payer: Self-pay

## 2024-03-10 ENCOUNTER — Ambulatory Visit (HOSPITAL_COMMUNITY)
Admission: RE | Admit: 2024-03-10 | Discharge: 2024-03-10 | Disposition: A | Discharge status: Home / Self Care | Payer: Medicare HMO | Source: Ambulatory Visit | Attending: Surgery | Admitting: Surgery

## 2024-03-10 ENCOUNTER — Encounter (HOSPITAL_COMMUNITY): Payer: Self-pay | Admitting: Surgery

## 2024-03-10 DIAGNOSIS — M1990 Unspecified osteoarthritis, unspecified site: Secondary | ICD-10-CM | POA: Diagnosis not present

## 2024-03-10 DIAGNOSIS — I110 Hypertensive heart disease with heart failure: Secondary | ICD-10-CM

## 2024-03-10 DIAGNOSIS — Z87891 Personal history of nicotine dependence: Secondary | ICD-10-CM | POA: Insufficient documentation

## 2024-03-10 DIAGNOSIS — I509 Heart failure, unspecified: Secondary | ICD-10-CM | POA: Diagnosis not present

## 2024-03-10 DIAGNOSIS — L723 Sebaceous cyst: Secondary | ICD-10-CM | POA: Diagnosis present

## 2024-03-10 DIAGNOSIS — L729 Follicular cyst of the skin and subcutaneous tissue, unspecified: Secondary | ICD-10-CM | POA: Diagnosis not present

## 2024-03-10 HISTORY — PX: EXCISION OF KELOID: SHX6267

## 2024-03-10 SURGERY — EXCISION, KELOID
Anesthesia: Monitor Anesthesia Care | Laterality: Right

## 2024-03-10 MED ORDER — ORAL CARE MOUTH RINSE: 15.0000 mL | Freq: Once | OROMUCOSAL | Status: AC

## 2024-03-10 MED ORDER — LIDOCAINE HCL (PF) 2 % IJ SOLN
INTRAMUSCULAR | Status: DC | PRN
  Administered 2024-03-10: 50 mg via INTRADERMAL

## 2024-03-10 MED ORDER — GLYCOPYRROLATE 0.2 MG/ML IJ SOLN
INTRAMUSCULAR | Status: DC | PRN
  Administered 2024-03-10: .2 mg via INTRAVENOUS

## 2024-03-10 MED ORDER — CHLORHEXIDINE GLUCONATE 4 % EX SOLN: 60.0000 mL | Freq: Once | CUTANEOUS | Status: DC

## 2024-03-10 MED ORDER — FENTANYL CITRATE PF 50 MCG/ML IJ SOSY: 25.0000 ug | PREFILLED_SYRINGE | INTRAMUSCULAR | Status: DC | PRN

## 2024-03-10 MED ORDER — OXYCODONE HCL 5 MG/5ML PO SOLN: 5.0000 mg | Freq: Once | ORAL | Status: DC | PRN

## 2024-03-10 MED ORDER — DEXAMETHASONE SODIUM PHOSPHATE 10 MG/ML IJ SOLN
INTRAMUSCULAR | Status: AC
  Filled 2024-03-10: qty 1

## 2024-03-10 MED ORDER — CEFAZOLIN SODIUM-DEXTROSE 2-4 GM/100ML-% IV SOLN
2.0000 g | INTRAVENOUS | Status: AC
  Administered 2024-03-10: 2 g via INTRAVENOUS
  Filled 2024-03-10: qty 100

## 2024-03-10 MED ORDER — OXYCODONE HCL 5 MG PO TABS: 5.0000 mg | ORAL_TABLET | Freq: Once | ORAL | Status: DC | PRN

## 2024-03-10 MED ORDER — FENTANYL CITRATE (PF) 100 MCG/2ML IJ SOLN
INTRAMUSCULAR | Status: DC | PRN
Start: 2024-03-10 — End: 2024-03-10
  Administered 2024-03-10 (×4): 25 ug via INTRAVENOUS

## 2024-03-10 MED ORDER — FENTANYL CITRATE PF 50 MCG/ML IJ SOSY
25.0000 ug | PREFILLED_SYRINGE | INTRAMUSCULAR | Status: DC | PRN
Start: 2024-03-10 — End: 2024-03-10

## 2024-03-10 MED ORDER — LACTATED RINGERS IV SOLN: INTRAVENOUS | Status: DC

## 2024-03-10 MED ORDER — PROPOFOL 10 MG/ML IV BOLUS
INTRAVENOUS | Status: AC
  Filled 2024-03-10: qty 20

## 2024-03-10 MED ORDER — LIDOCAINE HCL (PF) 2 % IJ SOLN
INTRAMUSCULAR | Status: AC
  Filled 2024-03-10: qty 5

## 2024-03-10 MED ORDER — ONDANSETRON HCL 4 MG/2ML IJ SOLN: 4.0000 mg | Freq: Once | INTRAMUSCULAR | Status: DC | PRN

## 2024-03-10 MED ORDER — OXYCODONE HCL 5 MG PO TABS: 5.0000 mg | ORAL_TABLET | ORAL | Status: DC | PRN

## 2024-03-10 MED ORDER — BUPIVACAINE-EPINEPHRINE (PF) 0.25% -1:200000 IJ SOLN
INTRAMUSCULAR | Status: AC
  Filled 2024-03-10: qty 30

## 2024-03-10 MED ORDER — 0.9 % SODIUM CHLORIDE (POUR BTL) OPTIME
TOPICAL | Status: DC | PRN
  Administered 2024-03-10: 1000 mL

## 2024-03-10 MED ORDER — MIDAZOLAM HCL 5 MG/5ML IJ SOLN
INTRAMUSCULAR | Status: DC | PRN
  Administered 2024-03-10: 2 mg via INTRAVENOUS

## 2024-03-10 MED ORDER — CHLORHEXIDINE GLUCONATE 0.12 % MT SOLN
15.0000 mL | Freq: Once | OROMUCOSAL | Status: AC
  Administered 2024-03-10: 15 mL via OROMUCOSAL

## 2024-03-10 MED ORDER — DEXAMETHASONE SODIUM PHOSPHATE 10 MG/ML IJ SOLN
INTRAMUSCULAR | Status: DC | PRN
Start: 2024-03-10 — End: 2024-03-10
  Administered 2024-03-10: 5 mg via INTRAVENOUS

## 2024-03-10 MED ORDER — EPHEDRINE SULFATE-NACL 50-0.9 MG/10ML-% IV SOSY
PREFILLED_SYRINGE | INTRAVENOUS | Status: DC | PRN
  Administered 2024-03-10 (×2): 7.5 mg via INTRAVENOUS

## 2024-03-10 MED ORDER — MIDAZOLAM HCL 2 MG/2ML IJ SOLN
INTRAMUSCULAR | Status: AC
  Filled 2024-03-10: qty 2

## 2024-03-10 MED ORDER — ONDANSETRON HCL 4 MG/2ML IJ SOLN
INTRAMUSCULAR | Status: DC | PRN
  Administered 2024-03-10: 4 mg via INTRAVENOUS

## 2024-03-10 MED ORDER — BUPIVACAINE-EPINEPHRINE 0.25% -1:200000 IJ SOLN
INTRAMUSCULAR | Status: DC | PRN
  Administered 2024-03-10: 30 mL

## 2024-03-10 MED ORDER — ACETAMINOPHEN 325 MG PO TABS: 650.0000 mg | ORAL_TABLET | ORAL | Status: DC | PRN

## 2024-03-10 MED ORDER — PROPOFOL 500 MG/50ML IV EMUL
INTRAVENOUS | Status: DC | PRN
  Administered 2024-03-10: 30 mg via INTRAVENOUS
  Administered 2024-03-10: 140 ug/kg/min via INTRAVENOUS

## 2024-03-10 MED ORDER — CHLORHEXIDINE GLUCONATE 4 % EX SOLN
60.0000 mL | Freq: Once | CUTANEOUS | Status: DC
Start: 2024-03-11 — End: 2024-03-10

## 2024-03-10 MED ORDER — FENTANYL CITRATE (PF) 100 MCG/2ML IJ SOLN
INTRAMUSCULAR | Status: AC
  Filled 2024-03-10: qty 2

## 2024-03-10 MED ORDER — ACETAMINOPHEN 500 MG PO TABS
1000.0000 mg | ORAL_TABLET | ORAL | Status: AC
  Administered 2024-03-10: 1000 mg via ORAL
  Filled 2024-03-10: qty 2

## 2024-03-10 MED ORDER — ONDANSETRON HCL 4 MG/2ML IJ SOLN
INTRAMUSCULAR | Status: AC
  Filled 2024-03-10: qty 2

## 2024-03-10 MED ORDER — ACETAMINOPHEN 650 MG RE SUPP: 650.0000 mg | RECTAL | Status: DC | PRN

## 2024-03-10 SURGICAL SUPPLY — 22 items
BAG COUNTER SPONGE SURGICOUNT (BAG) IMPLANT
COVER SURGICAL LIGHT HANDLE (MISCELLANEOUS) ×2 IMPLANT
DERMABOND ADVANCED .7 DNX12 (GAUZE/BANDAGES/DRESSINGS) IMPLANT
DRAPE LAPAROSCOPIC ABDOMINAL (DRAPES) IMPLANT
DRAPE UTILITY XL STRL (DRAPES) ×2 IMPLANT
ELECT REM PT RETURN 15FT ADLT (MISCELLANEOUS) ×2 IMPLANT
GAUZE SPONGE 4X4 12PLY STRL (GAUZE/BANDAGES/DRESSINGS) ×2 IMPLANT
GLOVE BIO SURGEON STRL SZ 6 (GLOVE) ×2 IMPLANT
GLOVE INDICATOR 6.5 STRL GRN (GLOVE) ×2 IMPLANT
GOWN STRL REUS W/ TWL LRG LVL3 (GOWN DISPOSABLE) ×2 IMPLANT
KIT BASIN OR (CUSTOM PROCEDURE TRAY) ×2 IMPLANT
KIT TURNOVER KIT A (KITS) IMPLANT
MARKER SKIN DUAL TIP RULER LAB (MISCELLANEOUS) IMPLANT
NDL HYPO 22X1.5 SAFETY MO (MISCELLANEOUS) ×2 IMPLANT
NEEDLE HYPO 22X1.5 SAFETY MO (MISCELLANEOUS) ×1 IMPLANT
PACK GENERAL/GYN (CUSTOM PROCEDURE TRAY) ×2 IMPLANT
SPIKE FLUID TRANSFER (MISCELLANEOUS) IMPLANT
STRIP CLOSURE SKIN 1/2X4 (GAUZE/BANDAGES/DRESSINGS) IMPLANT
SUT MNCRL AB 4-0 PS2 18 (SUTURE) ×2 IMPLANT
SUT VIC AB 3-0 SH 27XBRD (SUTURE) ×2 IMPLANT
SYR CONTROL 10ML LL (SYRINGE) ×2 IMPLANT
TOWEL OR 17X26 10 PK STRL BLUE (TOWEL DISPOSABLE) ×2 IMPLANT

## 2024-03-10 NOTE — Op Note (Signed)
 Operative Note  Gerald Bradley  [id number]  [id number]  03/10/2024   Surgeon: Phylliss Blakes MD FACS   Procedure performed: Excision of 4x3cm subcutaneous cyst, right posterior shoulder   Preop diagnosis: subcutaneous cyst  Post-op diagnosis/intraop findings: same   Specimens: no Retained items: no  EBL: minimal cc Complications: none   Description of procedure: After obtaining informed consent the patient was taken to the operating room and placed in the left lateral decubitus position. MAC was initiated, preoperative antibiotics were administered, SCDs applied, and a formal timeout was performed. The right shoulder was prepped and draped in the usual sterile fashion. After a field block with 0.25% marcaine with epinephrine, an elliptical incision was made following langhers' lines to include the central draining pore of the cyst. Sharp and cautery dissection were then used to dissect out and excise the entire cyst wall. The cyst did rupture during dissection with thin cottage-cheese like contents consistent with sebaceous cyst. Hemostasis was ensured in the wound with cautery. The incision was closed with interrupted deep dermal 3-0 vicryl and running subcuticular 4-0 monocryl. Benzoin, steri strips and sterile bandages were applied. The patient was then awakened and taken to PACU in stable condition.    All counts were correct at the completion of the case.

## 2024-03-10 NOTE — Transfer of Care (Signed)
 Immediate Anesthesia Transfer of Care Note  Patient: Gerald Bradley  Procedure(s) Performed: excision of sebaceous cyst right shoulder (Right)  Patient Location: PACU  Anesthesia Type:MAC  Level of Consciousness: drowsy  Airway & Oxygen Therapy: Patient Spontanous Breathing  Post-op Assessment: Report given to RN  Post vital signs: Reviewed and stable  Last Vitals:  Vitals Value Taken Time  BP 130/67 03/10/24 0915  Temp    Pulse 72 03/10/24 0917  Resp 18 03/10/24 0917  SpO2 100 % 03/10/24 0917  Vitals shown include unfiled device data.  Last Pain:  Vitals:   03/10/24 6045  TempSrc: Oral  PainSc: 0-No pain         Complications: No notable events documented.

## 2024-03-10 NOTE — Discharge Instructions (Signed)
 GENERAL SURGERY: POST OP INSTRUCTIONS  EAT Gradually transition to a high fiber diet with a fiber supplement over the next few weeks after discharge.  Start with a pureed / full liquid diet (see below)  WALK Walk an hour a day (cumulative, not all at once).  Control your pain to do that.    CONTROL PAIN Control pain so that you can walk, sleep, tolerate sneezing/coughing, go up/down stairs.  HAVE A BOWEL MOVEMENT DAILY Keep your bowels regular to avoid problems.  OK to try a laxative to override constipation.  OK to use an antidiarrheal to slow down diarrhea.  Call if not better after 2 tries  CALL IF YOU HAVE PROBLEMS/CONCERNS Call if you are still struggling despite following these instructions. Call if you have concerns not answered by these instructions    DIET: Follow a light bland diet & liquids the first 24 hours after arrival home, such as soup, liquids, starches, etc.  Be sure to drink plenty of fluids.  Quickly advance to a usual solid diet within a few days.  Avoid fast food or heavy meals as your are more likely to get nauseated or have irregular bowels.  A low-sugar, high-fiber diet for the rest of your life is ideal.   Take your usually prescribed home medications unless otherwise directed. PAIN CONTROL: Pain is best controlled by a usual combination of three different methods TOGETHER: Ice/Heat Over the counter pain medication Prescription pain medication Most patients will experience some swelling and bruising around the incisions.  Ice packs or heating pads (30-60 minutes up to 6 times a day) will help. Use ice for the first few days to help decrease swelling and bruising, then switch to heat to help relax tight/sore spots and speed recovery.  Some people prefer to use ice alone, heat alone, alternating between ice & heat.  Experiment to what works for you.  Swelling and bruising can take several weeks to resolve.   It is helpful to take an over-the-counter pain  medication regularly for the first few weeks.  Choose one of the following that works best for you: Naproxen (Aleve, etc)  Two 220mg  tabs twice a day OR Ibuprofen (Advil, etc) Three 200mg  tabs four times a day (every meal & bedtime) AND Acetaminophen (Tylenol, etc) 500-650mg  four times a day (every meal & bedtime) A  prescription for pain medication was not provided as you are already receiving a narcotic prescription from another provider.  Take your pain medication as prescribed.  If you are having problems/concerns with the prescription medicine (does not control pain, nausea, vomiting, rash, itching, etc), please call us [phone] to see if we need to switch you to a different pain medicine that will work better for you and/or control your side effect better. If you need a refill on your pain medication, please contact your pharmacy.  They will contact our office to request authorization. Prescriptions will not be filled after 5 pm or on week-ends. Avoid getting constipated.  Between the surgery and the pain medications, it is common to experience some constipation.  Increasing fluid intake and taking a fiber supplement (such as Metamucil, Citrucel, FiberCon, MiraLax, etc) 1-2 times a day regularly will usually help prevent this problem from occurring.  A mild laxative (prune juice, Milk of Magnesia, MiraLax, etc) should be taken according to package directions if there are no bowel movements after 48 hours.   Wash / shower every day, starting 2 days after surgery.  You may shower  over the steri strips as they are waterproof.  Continue to shower over incision(s) after the dressing is off. No rubbing, scrubbing, lotions or ointments to incision. Do not soak or submerge incision. Remove your outer bandage 2 days after surgery; steri strips will peel off after 1-2 weeks.  You may leave the incision open to air.  You may have skin tapes (Steri Strips) covering the incision(s).  Leave them on until one  week, then remove.  You may replace a dressing/Band-Aid to cover the incision for comfort if you wish.    ACTIVITIES as tolerated:   You may resume regular (light) daily activities beginning the next day--such as daily self-care, walking, climbing stairs--gradually increasing activities as tolerated.  If you can walk 30 minutes without difficulty, it is safe to try more intense activity such as jogging, treadmill, bicycling, low-impact aerobics, swimming, etc. Save the most intensive and strenuous activity for last such as sit-ups, heavy lifting, contact sports, etc  Refrain from any heavy lifting or straining until you are off narcotics for pain control.   DO NOT PUSH THROUGH PAIN.  Let pain be your guide: If it hurts to do something, don't do it.  Pain is your body warning you to avoid that activity for another week until the pain goes down. You may drive when you are no longer taking prescription pain medication, you can comfortably wear a seatbelt, and you can safely maneuver your car and apply brakes. You may have sexual intercourse when it is comfortable.  FOLLOW UP in our office Please call [facility] at [phone] to set up an appointment to see your surgeon in the office for a follow-up appointment approximately 2-3 weeks after your surgery. Make sure that you call for this appointment the day you arrive home to insure a convenient appointment time. 9. IF YOU HAVE DISABILITY OR FAMILY LEAVE FORMS, BRING THEM TO THE OFFICE FOR PROCESSING.  DO NOT GIVE THEM TO YOUR DOCTOR.   WHEN TO CALL us [phone]: Poor pain control Reactions / problems with new medications (rash/itching, nausea, etc)  Fever over 101.5 F (38.5 C) Worsening swelling or bruising Continued bleeding from incision. Increased pain, redness, or drainage from the incision Difficulty breathing / swallowing   The clinic staff is available to answer your questions during regular business hours (8:30am-5pm).  Please don't  hesitate to call and ask to speak to one of our nurses for clinical concerns.   If you have a medical emergency, go to the nearest emergency room or call 911.  A surgeon from [facility] is always on call at the [facility], [name] [address], [address], [place], [place]  [address] ? MAIN: [phone] ? TOLL FREE: [phone] ?  FAX [phone] [email or web address]

## 2024-03-10 NOTE — Interval H&P Note (Signed)
 History and Physical Interval Note:  03/10/2024 7:06 AM  Gerald Bradley  has presented today for surgery, with the diagnosis of sebaceous cyst.  The various methods of treatment have been discussed with the patient and family. After consideration of risks, benefits and other options for treatment, the patient has consented to  Procedure(s): excision of sebaceous cyst right shoulder (Right) as a surgical intervention.  The patient's history has been reviewed, patient examined, no change in status, stable for surgery.  I have reviewed the patient's chart and labs.  Questions were answered to the patient's satisfaction.     Katelinn Justice Lollie Sails

## 2024-03-10 NOTE — Anesthesia Postprocedure Evaluation (Signed)
 Anesthesia Post Note  Patient: Gerald Bradley  Procedure(s) Performed: excision of sebaceous cyst right shoulder (Right)     Patient location during evaluation: PACU Anesthesia Type: MAC Level of consciousness: awake and alert Pain management: pain level controlled Vital Signs Assessment: post-procedure vital signs reviewed and stable Respiratory status: spontaneous breathing, nonlabored ventilation and respiratory function stable Cardiovascular status: stable and blood pressure returned to baseline Anesthetic complications: no   No notable events documented.  Last Vitals:  Vitals:   03/10/24 0915 03/10/24 0930  BP: 130/67 138/76  Pulse: 76 62  Resp: 19 17  Temp: 36.5 C   SpO2: 100% 93%    Last Pain:  Vitals:   03/10/24 0930  TempSrc:   PainSc: 0-No pain                 Beryle Lathe

## 2024-03-11 ENCOUNTER — Encounter (HOSPITAL_COMMUNITY): Payer: Self-pay | Admitting: Surgery

## 2024-07-14 ENCOUNTER — Emergency Department (HOSPITAL_COMMUNITY)

## 2024-07-14 ENCOUNTER — Emergency Department (HOSPITAL_COMMUNITY)
Admission: EM | Admit: 2024-07-14 | Discharge: 2024-07-15 | Disposition: A | Discharge status: Home / Self Care | Attending: Emergency Medicine | Admitting: Emergency Medicine

## 2024-07-14 DIAGNOSIS — W230XXA Caught, crushed, jammed, or pinched between moving objects, initial encounter: Secondary | ICD-10-CM | POA: Insufficient documentation

## 2024-07-14 DIAGNOSIS — S61411A Laceration without foreign body of right hand, initial encounter: Secondary | ICD-10-CM | POA: Diagnosis not present

## 2024-07-14 DIAGNOSIS — Y990 Civilian activity done for income or pay: Secondary | ICD-10-CM | POA: Insufficient documentation

## 2024-07-14 DIAGNOSIS — Z23 Encounter for immunization: Secondary | ICD-10-CM | POA: Diagnosis not present

## 2024-07-14 DIAGNOSIS — S6991XA Unspecified injury of right wrist, hand and finger(s), initial encounter: Secondary | ICD-10-CM | POA: Diagnosis present

## 2024-07-14 DIAGNOSIS — S0101XA Laceration without foreign body of scalp, initial encounter: Secondary | ICD-10-CM | POA: Insufficient documentation

## 2024-07-14 DIAGNOSIS — Z79899 Other long term (current) drug therapy: Secondary | ICD-10-CM | POA: Insufficient documentation

## 2024-07-14 LAB — COMPREHENSIVE METABOLIC PANEL WITH GFR
ALT: 22 U/L (ref 0–44)
AST: 28 U/L (ref 15–41)
Albumin: 3.6 g/dL (ref 3.5–5.0)
Alkaline Phosphatase: 34 U/L — ABNORMAL LOW (ref 38–126)
Anion gap: 14 (ref 5–15)
BUN: 10 mg/dL (ref 8–23)
CO2: 17 mmol/L — ABNORMAL LOW (ref 22–32)
Calcium: 9.1 mg/dL (ref 8.9–10.3)
Chloride: 110 mmol/L (ref 98–111)
Creatinine, Ser: 0.89 mg/dL (ref 0.61–1.24)
GFR, Estimated: >60 mL/min (ref >60)
Glucose, Bld: 82 mg/dL (ref 70–99)
Potassium: 3.3 mmol/L — ABNORMAL LOW (ref 3.5–5.1)
Sodium: 141 mmol/L (ref 135–145)
Total Bilirubin: 1.1 mg/dL (ref 0.0–1.2)
Total Protein: 6.9 g/dL (ref 6.5–8.1)

## 2024-07-14 LAB — I-STAT CHEM 8, ED
BUN: 10 mg/dL (ref 8–23)
Calcium, Ion: 1.18 mmol/L (ref 1.15–1.40)
Chloride: 114 mmol/L — ABNORMAL HIGH (ref 98–111)
Creatinine, Ser: 0.9 mg/dL (ref 0.61–1.24)
Glucose, Bld: 80 mg/dL (ref 70–99)
HCT: 42 % (ref 39.0–52.0)
Hemoglobin: 14.3 g/dL (ref 13.0–17.0)
Potassium: 3.4 mmol/L — ABNORMAL LOW (ref 3.5–5.1)
Sodium: 146 mmol/L — ABNORMAL HIGH (ref 135–145)
TCO2: 18 mmol/L — ABNORMAL LOW (ref 22–32)

## 2024-07-14 LAB — CBC
HCT: 42.8 % (ref 39.0–52.0)
Hemoglobin: 14.9 g/dL (ref 13.0–17.0)
MCH: 31.7 pg (ref 26.0–34.0)
MCHC: 34.8 g/dL (ref 30.0–36.0)
MCV: 91.1 fL (ref 80.0–100.0)
Platelets: 245 K/uL (ref 150–400)
RBC: 4.7 MIL/uL (ref 4.22–5.81)
RDW: 12.5 % (ref 11.5–15.5)
WBC: 5.9 K/uL (ref 4.0–10.5)
nRBC: 0 % (ref 0.0–0.2)

## 2024-07-14 LAB — ETHANOL: Alcohol, Ethyl (B): <15 mg/dL (ref <15)

## 2024-07-14 MED ORDER — SODIUM CHLORIDE 0.9 % IV BOLUS
125.0000 mL | Freq: Once | INTRAVENOUS | Status: AC
  Administered 2024-07-14: 125 mL via INTRAVENOUS

## 2024-07-14 MED ORDER — ONDANSETRON HCL 4 MG/2ML IJ SOLN
4.0000 mg | Freq: Once | INTRAMUSCULAR | Status: AC
  Administered 2024-07-14: 4 mg via INTRAVENOUS
  Filled 2024-07-14: qty 2

## 2024-07-14 MED ORDER — LIDOCAINE HCL (PF) 1 % IJ SOLN
10.0000 mL | Freq: Once | INTRAMUSCULAR | Status: AC
  Administered 2024-07-15: 10 mL

## 2024-07-14 MED ORDER — TETANUS-DIPHTH-ACELL PERTUSSIS 5-2.5-18.5 LF-MCG/0.5 IM SUSY
0.5000 mL | PREFILLED_SYRINGE | Freq: Once | INTRAMUSCULAR | Status: AC
  Administered 2024-07-14: 0.5 mL via INTRAMUSCULAR
  Filled 2024-07-14: qty 0.5

## 2024-07-14 MED ORDER — FENTANYL CITRATE PF 50 MCG/ML IJ SOSY
50.0000 ug | PREFILLED_SYRINGE | Freq: Once | INTRAMUSCULAR | Status: AC
  Administered 2024-07-14: 50 ug via INTRAVENOUS
  Filled 2024-07-14: qty 1

## 2024-07-14 MED ORDER — LIDOCAINE HCL (PF) 1 % IJ SOLN
10.0000 mL | Freq: Once | INTRAMUSCULAR | Status: AC
Start: 2024-07-14 — End: 2024-07-15
  Administered 2024-07-15: 10 mL
  Filled 2024-07-14: qty 10

## 2024-07-14 MED ORDER — CEFAZOLIN SODIUM-DEXTROSE 2-4 GM/100ML-% IV SOLN
2.0000 g | Freq: Once | INTRAVENOUS | Status: AC
  Administered 2024-07-14: 2 g via INTRAVENOUS
  Filled 2024-07-14: qty 100
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
## 2024-07-14 NOTE — Progress Notes (Signed)
 Orthopedic Tech Progress Note Patient Details:  Gerald Bradley 10/02/1956 [id number]  Patient ID: [name], male   DOB: 1956/01/18, 68 y.o.   MRN: [id number]  LV2T no open fx. Puncture wounds visible but no deformities. Old fx of the 5th digit visible on xrays. May or may not be needed. Shawon Denzer L Aaliyana Fredericks 07/14/2024, 10:26 PM

## 2024-07-14 NOTE — ED Triage Notes (Addendum)
 PT BIB EMS. EMS reports pt caught right hand using jack while working on car. Hand stuck for about 4-5 mins, small lac right forehead. No LOC, No thinners.   EMS VS 180/100, HR 70, 98% RA

## 2024-07-14 NOTE — ED Notes (Signed)
 Pt hand soaking in sterile saline and betadine, per verbal order.

## 2024-07-15 ENCOUNTER — Other Ambulatory Visit: Payer: Self-pay

## 2024-07-15 DIAGNOSIS — S61411A Laceration without foreign body of right hand, initial encounter: Secondary | ICD-10-CM | POA: Diagnosis not present
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
## 2024-07-15 NOTE — Discharge Instructions (Signed)
 Use ice, Tylenol , Motrin  for pain control.  Elevate your hand.  WOUND CARE Please have your stitches/staples removed in 8 or sooner if you have concerns. You may do this at any available urgent care or at your primary care doctor's office.  Keep area clean and dry for 24 hours. Do not remove bandage, if applied.  After 24 hours, remove bandage and wash wound gently with mild soap and warm water. Reapply a new bandage after cleaning wound, if directed.  Continue daily cleansing with soap and water until stitches/staples are removed.  Do not apply any ointments or creams to the wound while stitches/staples are in place, as this may cause delayed healing.  Seek medical careif you experience any of the following signs of infection: Swelling, redness, pus drainage, streaking, fever >101.0 F  Seek care if you experience excessive bleeding that does not stop after 15-20 minutes of constant, firm pressure.   Do not scratch, rub, or pick at the adhesive. Leave tissue adhesive in place. It will come off naturally after 7-10 days. Do not place tape over the adhesive. The adhesive could come off the wound when you pull the tape off. Protect the wound from further injury until it is healed. Check your wound area every day for signs of infection. Check for: More redness, swelling, or pain,Fluid or blood,Warmth, Pus or a bad smell. Do not take baths, swim, or use a hot tub until your health care provider approves. You may only be allowed to take sponge baths. Ask your health care provider if you may take showers.You can usually shower after the first 24 hours. Cover the dressing with a watertight covering when you take a shower. Do not soak the area where there is tissue adhesive. Do not use any soaps, petroleum jelly products, or ointments on the wound. Certain ointments can weaken the adhesive.

## 2024-08-09 ENCOUNTER — Encounter (HOSPITAL_COMMUNITY): Payer: Self-pay

## 2024-08-09 ENCOUNTER — Ambulatory Visit (HOSPITAL_COMMUNITY): Admission: EM | Admit: 2024-08-09 | Discharge: 2024-08-09 | Disposition: A | Discharge status: Home / Self Care

## 2024-08-09 DIAGNOSIS — H539 Unspecified visual disturbance: Secondary | ICD-10-CM

## 2024-08-09 DIAGNOSIS — G8929 Other chronic pain: Secondary | ICD-10-CM

## 2024-08-09 DIAGNOSIS — R519 Headache, unspecified: Secondary | ICD-10-CM | POA: Diagnosis not present

## 2024-08-09 DIAGNOSIS — M25512 Pain in left shoulder: Secondary | ICD-10-CM | POA: Diagnosis not present

## 2024-08-09 MED ORDER — NAPROXEN 375 MG PO TABS: 375.0000 mg | ORAL_TABLET | Freq: Two times a day (BID) | ORAL | 0 refills | Status: AC
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
## 2024-08-09 NOTE — Discharge Instructions (Signed)
 You can take naproxen  twice daily as needed for shoulder pain.  Do not take this with other NSAIDs including ibuprofen , Advil , Motrin , and Aleve . You can take 500 to 1000 mg of Tylenol  every 6-8 hours as needed for breakthrough pain.  Do not exceed 4000 mg in a day. You can continue to take your previously prescribed oxycodone  as needed for pain to if this helps. Follow-up with [place] [facility] for further evaluation of your ongoing shoulder pain. Follow-up with [facility] ophthalmology for further evaluation of your ongoing eye pain and visual disturbances. Otherwise follow-up with your primary care provider or return here as needed.
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
## 2024-08-20 ENCOUNTER — Ambulatory Visit: Admitting: Family Medicine

## 2024-08-20 ENCOUNTER — Encounter: Payer: Self-pay | Admitting: Family Medicine

## 2024-08-20 VITALS — BP 150/90 | Ht 70.0 in | Wt 179.0 lb

## 2024-08-20 DIAGNOSIS — M25512 Pain in left shoulder: Secondary | ICD-10-CM | POA: Diagnosis not present

## 2024-08-20 MED ORDER — METHYLPREDNISOLONE ACETATE 40 MG/ML IJ SUSP
40.0000 mg | Freq: Once | INTRAMUSCULAR | Status: AC
  Administered 2024-08-20: 40 mg via INTRA_ARTICULAR

## 2024-08-20 NOTE — Progress Notes (Signed)
 PCP: Alec House, MD  Subjective:   HPI: Patient is a 68 y.o. male here for evaluation of left shoulder pain.  Patient reports that his left shoulder pain has been present for about 1 year and that it might have started after he over extended it. He says the pain is worse with moving arm above shoulder level. He has tried taking Naproxen  and Oxycodone  for the pain but they have not been helpful. He reports that the pain in his shoulder keeps him up at night and he feels as though he constantly has to keep repositioning his arm. Patient is right-handed.   Past Medical History:  Diagnosis Date   Alcohol dependence (HCC)    Arthritis    Bradycardia    Cannabis abuse    Cardiomyopathy (HCC)    Cataract    CHF (congestive heart failure) (HCC)    Dysrhythmia    bradycardia   Fatty liver    History of pneumothorax    History of rectal bleeding    Homelessness    Hypercholesterolemia    Hypertension    Nicotine dependence    Osteoarthritis    Overweight    Rectal bleeding    Hx of   SOB (shortness of breath)    Toe pain, right    Trapezius muscle spasm     Current Outpatient Medications on File Prior to Visit  Medication Sig Dispense Refill   acetaminophen  (TYLENOL ) 500 MG tablet Take 1,000 mg by mouth every 8 (eight) hours as needed for mild pain (pain score 1-3), moderate pain (pain score 4-6) or headache.     allopurinol (ZYLOPRIM) 100 MG tablet Take 100 mg by mouth 2 (two) times daily.     baclofen (LIORESAL) 10 MG tablet Take 10 mg by mouth 2 (two) times daily.     doxycycline  (VIBRAMYCIN ) 100 MG capsule Take 1 capsule (100 mg total) by mouth 2 (two) times daily. (Patient taking differently: Take 100 mg by mouth daily.) 20 capsule 0   losartan  (COZAAR ) 25 MG tablet Take 1 tablet (25 mg total) by mouth daily. 30 tablet 5   naloxone (NARCAN) nasal spray 4 mg/0.1 mL Place 0.4 mg into the nose as needed (OR).     naproxen  (NAPROSYN ) 375 MG tablet Take 1 tablet (375 mg total) by  mouth 2 (two) times daily. 20 tablet 0   oxyCODONE  HCl 7.5 MG TABS Take 7.5 mg by mouth 3 (three) times daily as needed.     rosuvastatin  (CRESTOR ) 10 MG tablet Take 1 tablet (10 mg total) by mouth daily. Please make overdue appt with Dr. Hobart before anymore refills. Thank you 1st attempt (Patient taking differently: Take 5 mg by mouth daily. Please make overdue appt with Dr. Hobart before anymore refills. Thank you 1st attempt) 15 tablet 0   topiramate (TOPAMAX) 25 MG capsule Take 25 mg by mouth daily.     trimethoprim -polymyxin b  (POLYTRIM ) ophthalmic solution Apply 1 drop to eye.     [DISCONTINUED] omeprazole (PRILOSEC) 20 MG capsule Take 40 mg by mouth daily.     No current facility-administered medications on file prior to visit.    Past Surgical History:  Procedure Laterality Date   EXCISION OF KELOID Right 03/10/2024   Procedure: excision of sebaceous cyst right shoulder;  Surgeon: Signe Mitzie LABOR, MD;  Location: [facility];  Service: General;  Laterality: Right;   LUNG SURGERY     resection for spontaneous pneumothorax skin graft   SHOULDER SURGERY Right  rotator cuff repair   SKIN SURGERY     right 5th finger    Allergies  Allergen Reactions   Iohexol Hives    Patient didn't receive benadryl hives subsided.    BP (!) 150/90 (BP Location: Left Arm, Patient Position: Sitting)   Ht 5' 10 (1.778 m)   Wt 179 lb (81.2 kg)   BMI 25.68 kg/m       No data to display              No data to display              Objective:  Physical Exam:  Gen: NAD, comfortable in exam room  MSK: left shoulder Inspection: No bony or soft tissue abnormalities. There is mild atrophy of the left deltoid, otherwise, appears symmetrical in comparison to right shoulder.  Palpation: There is tenderness to palpation of the upper trapezius, bicipital groove, and deltoid insertion point on the humerus. No tenderness to palpation of th clavicle, AC joint, or posterior shoulder.   ROM: Limited range of motion in all planes compared to right shoulder due to pain. Especially with forward flexion and abduction beyond 90 degrees.  Strength: 5/5 with internal/external rotation and abduction, however, patient endorses discomfort with resisted external rotation and abduction. 5/5 grip strength and wrist extension/flexion.  Neuro/Vasc: Neurovascularly intact distally, with the exception of diminished sensation of the left forearm and 2nd and third fingers of the left hand (C5 distribution).  Special Tests: Positive Empty can, positive O'Brien's, positive Hawkin's, negative Neer's, negative Speed's, negative Yergason's    Assessment & Plan:  1. Chronic left shoulder pain - Suspect patients chronic left shoulder pain is due to underlying rotator cuff dysfunction based on his history and exam in clinic today. However, cannot completely rule out cervical pathology at this time. Therefore, discussed with patient that we will treat today with a subacromial steroid injection to see if this is able to relieve his symptoms. Will also provide patient exercises at home for strengthening the muscles in his shoulder. Advised patient to follow-up in 1 month for re-evaluation.Patient is amenable to this plan.   Shoulder Injection  PROCEDURE: INJECTION: Patient was given informed consent, signed copy in the chart. Appropriate time out was taken. Area prepped and draped in usual sterile fashion. Ethyl chloride was  used for local anesthesia. A 21 gauge 1 1/2 inch needle was used.. 1 cc of methylprednisolone  40 mg/ml plus  4 cc of 1% lidocaine  without epinephrine  was injected into the left subacromial bursa using a(n) posterior approach.   The patient tolerated the procedure well. There were no complications. Post procedure instructions were given.   Plan - Subacromial steroid injection today for relief of symptoms - Provided patient with home shoulder PT - Consider imaging in the future if  patient fails to improve with steroid injection - Follow-up in 1 month  Signe Ravel, MS4 [facility]

## 2024-08-20 NOTE — Progress Notes (Signed)
 [facility] Attending Note: I have seen and examined this patient with the medical student. I have  reviewed the history, physical examination, assessment and plan as documented in the medical student's note.  I agree with the medical student's note and findings, assessment and treatment plan as documented with the following additions or changes:  I performed the injection

## 2024-09-17 ENCOUNTER — Ambulatory Visit: Admitting: Family Medicine

## 2024-09-17 VITALS — BP 138/80 | Ht 70.0 in | Wt 179.0 lb

## 2024-09-17 DIAGNOSIS — M25512 Pain in left shoulder: Secondary | ICD-10-CM | POA: Diagnosis not present

## 2024-09-17 NOTE — Progress Notes (Signed)
 PCP: Alec House, MD  Subjective:   HPI: Patient is a 68 y.o. male here for follow-up of left shoulder pain.  Patient was evaluated on 08/20/2024 and given a steroid injection for suspected rotator cuff dysfunction.  He was also given home exercises.  Patient states he has been doing the exercises and has had improvement in range of motion and strength of his left shoulder.  He feels that things are getting back to normal.  He is able to do all of his daily activities without much pain.  This is not waking him up at night.  He will take anti-inflammatories periodically.  Past Medical History:  Diagnosis Date   Alcohol dependence (HCC)    Arthritis    Bradycardia    Cannabis abuse    Cardiomyopathy (HCC)    Cataract    CHF (congestive heart failure) (HCC)    Dysrhythmia    bradycardia   Fatty liver    History of pneumothorax    History of rectal bleeding    Homelessness    Hypercholesterolemia    Hypertension    Nicotine dependence    Osteoarthritis    Overweight    Rectal bleeding    Hx of   SOB (shortness of breath)    Toe pain, right    Trapezius muscle spasm     Current Outpatient Medications on File Prior to Visit  Medication Sig Dispense Refill   acetaminophen  (TYLENOL ) 500 MG tablet Take 1,000 mg by mouth every 8 (eight) hours as needed for mild pain (pain score 1-3), moderate pain (pain score 4-6) or headache.     allopurinol (ZYLOPRIM) 100 MG tablet Take 100 mg by mouth 2 (two) times daily.     baclofen (LIORESAL) 10 MG tablet Take 10 mg by mouth 2 (two) times daily.     doxycycline  (VIBRAMYCIN ) 100 MG capsule Take 1 capsule (100 mg total) by mouth 2 (two) times daily. (Patient taking differently: Take 100 mg by mouth daily.) 20 capsule 0   losartan  (COZAAR ) 25 MG tablet Take 1 tablet (25 mg total) by mouth daily. 30 tablet 5   naloxone (NARCAN) nasal spray 4 mg/0.1 mL Place 0.4 mg into the nose as needed (OR).     naproxen  (NAPROSYN ) 375 MG tablet Take 1 tablet  (375 mg total) by mouth 2 (two) times daily. 20 tablet 0   oxyCODONE  HCl 7.5 MG TABS Take 7.5 mg by mouth 3 (three) times daily as needed.     rosuvastatin  (CRESTOR ) 10 MG tablet Take 1 tablet (10 mg total) by mouth daily. Please make overdue appt with Dr. Hobart before anymore refills. Thank you 1st attempt (Patient taking differently: Take 5 mg by mouth daily. Please make overdue appt with Dr. Hobart before anymore refills. Thank you 1st attempt) 15 tablet 0   topiramate (TOPAMAX) 25 MG capsule Take 25 mg by mouth daily.     trimethoprim -polymyxin b  (POLYTRIM ) ophthalmic solution Apply 1 drop to eye.     [DISCONTINUED] omeprazole (PRILOSEC) 20 MG capsule Take 40 mg by mouth daily.     No current facility-administered medications on file prior to visit.    BP 138/80   Ht 5' 10 (1.778 m)   Wt 179 lb (81.2 kg)   BMI 25.68 kg/m        Objective:   Physical Exam:  Gen: NAD, comfortable in exam room Left shoulder Inspection: Free from edema, erythema or warmth Palpation: No tenderness to palpation ROM: Shoulder flexion to 170  degrees equal to the right, abduction 170 degrees equal to the right Special Tests: Negative empty can test, equal with internal and external rotation if not stronger on the left, O'Brien's positive, Hawkins positive, Neer's negative Neuro: Sensation is intact bilaterally,  Assessment/Plan:   Gerald Bradley is a 68 y.o. male who was seen today for the following: 1. Left Rotator Cuff Dysfunction (Primary) - Since the injection and home exercises patient has done very well compared to his previous visit - He has nearly full range of motion and equal strength - He should continue his home exercises a couple times a week - He can follow-up with us  as needed  Follow-up/Education:   Return if symptoms worsen or fail to improve.   May return sooner as needed and encouraged to call/e-mail for additional questions or  worsening symptoms in the  interim.  Krystal Lowing, DO Sports Medicine Fellow 09/17/2024 11:55 AM

## 2024-09-18 ENCOUNTER — Encounter: Payer: Self-pay | Admitting: Family Medicine

## 2024-09-18 NOTE — Progress Notes (Signed)
[facility]: Attending Note: I have examined the patient, reviewed the chart, discussed the assessment and plan with the Sports Medicine Fellow. I agree with assessment and treatment plan as detailed in the Fellow's note.
